# Patient Record
Sex: Female | Born: 1990 | Race: Black or African American | Hispanic: No | Marital: Single | Smoking: Never smoker
Health system: Southern US, Community
[De-identification: ages and names within clinical notes are randomized; demographics above are authoritative.]

## PROBLEM LIST (undated history)

## (undated) ENCOUNTER — Inpatient Hospital Stay (HOSPITAL_COMMUNITY): Payer: Self-pay

## (undated) DIAGNOSIS — J45909 Unspecified asthma, uncomplicated: Secondary | ICD-10-CM

## (undated) DIAGNOSIS — L309 Dermatitis, unspecified: Secondary | ICD-10-CM

## (undated) DIAGNOSIS — D649 Anemia, unspecified: Secondary | ICD-10-CM

## (undated) HISTORY — PX: WISDOM TOOTH EXTRACTION: SHX21

---

## 2013-12-11 ENCOUNTER — Encounter (HOSPITAL_BASED_OUTPATIENT_CLINIC_OR_DEPARTMENT_OTHER): Payer: Self-pay | Admitting: Emergency Medicine

## 2013-12-11 ENCOUNTER — Emergency Department (HOSPITAL_BASED_OUTPATIENT_CLINIC_OR_DEPARTMENT_OTHER)
Admission: EM | Admit: 2013-12-11 | Discharge: 2013-12-11 | Disposition: A | Discharge status: Home / Self Care | Payer: Self-pay | Attending: Emergency Medicine | Admitting: Emergency Medicine

## 2013-12-11 DIAGNOSIS — J45909 Unspecified asthma, uncomplicated: Secondary | ICD-10-CM | POA: Insufficient documentation

## 2013-12-11 DIAGNOSIS — B9689 Other specified bacterial agents as the cause of diseases classified elsewhere: Secondary | ICD-10-CM | POA: Insufficient documentation

## 2013-12-11 DIAGNOSIS — N760 Acute vaginitis: Secondary | ICD-10-CM | POA: Insufficient documentation

## 2013-12-11 DIAGNOSIS — Z3202 Encounter for pregnancy test, result negative: Secondary | ICD-10-CM | POA: Insufficient documentation

## 2013-12-11 DIAGNOSIS — A499 Bacterial infection, unspecified: Secondary | ICD-10-CM | POA: Insufficient documentation

## 2013-12-11 DIAGNOSIS — N390 Urinary tract infection, site not specified: Secondary | ICD-10-CM | POA: Insufficient documentation

## 2013-12-11 DIAGNOSIS — A599 Trichomoniasis, unspecified: Secondary | ICD-10-CM | POA: Insufficient documentation

## 2013-12-11 DIAGNOSIS — Z862 Personal history of diseases of the blood and blood-forming organs and certain disorders involving the immune mechanism: Secondary | ICD-10-CM | POA: Insufficient documentation

## 2013-12-11 HISTORY — DX: Anemia, unspecified: D64.9

## 2013-12-11 HISTORY — DX: Unspecified asthma, uncomplicated: J45.909

## 2013-12-11 LAB — URINALYSIS, ROUTINE W REFLEX MICROSCOPIC
Bilirubin Urine: NEGATIVE
GLUCOSE, UA: NEGATIVE mg/dL
KETONES UR: NEGATIVE mg/dL
Nitrite: NEGATIVE
PH: 6 (ref 5.0–8.0)
Protein, ur: NEGATIVE mg/dL
Specific Gravity, Urine: 1.019 (ref 1.005–1.030)
Urobilinogen, UA: 0.2 mg/dL (ref 0.0–1.0)

## 2013-12-11 LAB — WET PREP, GENITAL: Yeast Wet Prep HPF POC: NONE SEEN

## 2013-12-11 LAB — URINE MICROSCOPIC-ADD ON

## 2013-12-11 LAB — PREGNANCY, URINE: Preg Test, Ur: NEGATIVE

## 2013-12-11 MED ORDER — CEFTRIAXONE SODIUM 250 MG IJ SOLR
250.0000 mg | Freq: Once | INTRAMUSCULAR | Status: AC
  Administered 2013-12-11: 250 mg via INTRAMUSCULAR
  Filled 2013-12-11: qty 250

## 2013-12-11 MED ORDER — METRONIDAZOLE 500 MG PO TABS: 500.0000 mg | ORAL_TABLET | Freq: Two times a day (BID) | ORAL | Status: DC

## 2013-12-11 MED ORDER — AZITHROMYCIN 250 MG PO TABS
1000.0000 mg | ORAL_TABLET | Freq: Once | ORAL | Status: AC
  Administered 2013-12-11: 1000 mg via ORAL
  Filled 2013-12-11: qty 4

## 2013-12-11 NOTE — ED Notes (Signed)
VSS, patient comfortable, no adverse reaction reaction to antibiotic

## 2013-12-11 NOTE — Discharge Instructions (Signed)
It is important that you follow up with the health department for further screening for HIV, Hepatitis and Syphilis.

## 2013-12-11 NOTE — ED Notes (Signed)
Here to 'be checked out.'  Wants evaluation for an STD d/t vaginal discharge (white, non odorous).  Reports two female partners, has only used a condom with one of the partners.  Denies prior history of STD.

## 2013-12-11 NOTE — ED Provider Notes (Signed)
CSN: [id number]     Arrival date & time 12/11/13  1505 History   First MD Initiated Contact with Patient 12/11/13 1509     Chief Complaint  Patient presents with  . Vaginal Discharge     (Consider location/radiation/quality/duration/timing/severity/associated sxs/prior Treatment) Patient is a 23 y.o. female presenting with vaginal discharge. The history is provided by the patient.  Vaginal Discharge Quality:  White and bloody Severity:  Moderate Onset quality:  Gradual Duration:  1 week Timing:  Constant Progression:  Worsening Chronicity:  New Relieved by:  Nothing Worsened by:  Nothing tried Ineffective treatments:  None tried Associated symptoms: vaginal itching   Associated symptoms: no dyspareunia, no dysuria, no fever, no nausea, no urinary frequency, no urinary hesitancy, no urinary incontinence and no vomiting  Abdominal pain: cramping with menses.   Risk factors: foreign body, new sexual partner and unprotected sex    Zannie CoveKameisha Tench is a 23 y.o. female who presents to the ED with vaginal discharge that started over a week ago. She is currently having her period. She has been with her current sex partner x one month. She had 2 partners prior to that. She request testing for STI's. She does not use birth control. She has had abdominal cramping which is usually for he period time.   Past Medical History  Diagnosis Date  . Anemia   . Asthma    Past Surgical History  Procedure Laterality Date  . Wisdom tooth extraction     No family history on file. History  Substance Use Topics  . Smoking status: Never Smoker   . Smokeless tobacco: Not on file  . Alcohol Use: No   OB History   Grav Para Term Preterm Abortions TAB SAB Ect Mult Living                 Review of Systems  Constitutional: Negative for fever and chills.  HENT: Negative.   Eyes: Negative for visual disturbance.  Respiratory: Negative for cough and shortness of breath.   Cardiovascular: Negative  for chest pain.  Gastrointestinal: Negative for nausea and vomiting. Abdominal pain: cramping with menses.  Genitourinary: Positive for vaginal discharge. Negative for bladder incontinence, dysuria, hesitancy, urgency, frequency and dyspareunia. Vaginal pain: itching.  Musculoskeletal: Negative for back pain.  Skin: Negative for rash.  Neurological: Negative for light-headedness and headaches.  Psychiatric/Behavioral: The patient is not nervous/anxious.       Allergies  Review of patient's allergies indicates no known allergies.  Home Medications  No current outpatient prescriptions on file. BP 128/89  Pulse 72  Temp(Src) 98.9 F (37.2 C) (Oral)  Resp 18  Ht 5\' 7"  (1.702 m)  Wt 147 lb (66.679 kg)  BMI 23.02 kg/m2  SpO2 96%  LMP 12/07/2013 Physical Exam  Nursing note and vitals reviewed. Constitutional: She is oriented to person, place, and time. She appears well-developed and well-nourished. No distress.  HENT:  Head: Normocephalic and atraumatic.  Eyes: EOM are normal.  Neck: Neck supple.  Cardiovascular: Normal rate.   Pulmonary/Chest: Effort normal.  Abdominal: Soft. There is no tenderness.  Genitourinary:  External genitalia without lesions, frothy malodorous discharge vaginal vault. Positive CMT, no adnexal tenderness. Uterus without palpable enlargement.   Musculoskeletal: Normal range of motion.  Neurological: She is alert and oriented to person, place, and time. No cranial nerve deficit.  Skin: Skin is warm and dry.  Psychiatric: She has a normal mood and affect. Her behavior is normal.   Results for orders placed during  the hospital encounter of 12/11/13 (from the past 24 hour(s))  PREGNANCY, URINE     Status: None   Collection Time    12/11/13  3:26 PM      Result Value Ref Range   Preg Test, Ur NEGATIVE  NEGATIVE  URINALYSIS, ROUTINE W REFLEX MICROSCOPIC     Status: Abnormal   Collection Time    12/11/13  3:26 PM      Result Value Ref Range   Color,  Urine YELLOW  YELLOW   APPearance CLOUDY (*) CLEAR   Specific Gravity, Urine 1.019  1.005 - 1.030   pH 6.0  5.0 - 8.0   Glucose, UA NEGATIVE  NEGATIVE mg/dL   Hgb urine dipstick MODERATE (*) NEGATIVE   Bilirubin Urine NEGATIVE  NEGATIVE   Ketones, ur NEGATIVE  NEGATIVE mg/dL   Protein, ur NEGATIVE  NEGATIVE mg/dL   Urobilinogen, UA 0.2  0.0 - 1.0 mg/dL   Nitrite NEGATIVE  NEGATIVE   Leukocytes, UA MODERATE (*) NEGATIVE  URINE MICROSCOPIC-ADD ON     Status: Abnormal   Collection Time    12/11/13  3:26 PM      Result Value Ref Range   Squamous Epithelial / LPF FEW (*) RARE   WBC, UA 3-6  <3 WBC/hpf   RBC / HPF 0-2  <3 RBC/hpf   Bacteria, UA MANY (*) RARE   Urine-Other MUCOUS PRESENT    WET PREP, GENITAL     Status: Abnormal   Collection Time    12/11/13  4:14 PM      Result Value Ref Range   Yeast Wet Prep HPF POC NONE SEEN  NONE SEEN   Trich, Wet Prep TOO NUMEROUS TO COUNT (*) NONE SEEN   Clue Cells Wet Prep HPF POC MANY (*) NONE SEEN   WBC, Wet Prep HPF POC FEW (*) NONE SEEN     ED Course  Procedures  MDM  23 y.o. female with multiple sex partners, vaginal discharge and requesting STI check. Wet prep positive for trichomonas and bacterial vaginosis. Will treat with Rocephin 250 mg IM and zithromax 1 gram PO here in the ED prior to discharge. Will give Rx for Flagyl. Encouraged patient to follow up with the health department for further testing for HIV, Hepatitis and Syphilis. She agrees.  Stable for discharge without any immediate complications. Discussed safe sex.    Medication List         metroNIDAZOLE 500 MG tablet  Commonly known as:  FLAGYL  Take 1 tablet (500 mg total) by mouth 2 (two) times daily.           Janne Napoleon, [place] 12/11/13 [phone]

## 2013-12-12 NOTE — ED Provider Notes (Signed)
Medical screening examination/treatment/procedure(s) were performed by non-physician practitioner and as supervising physician I was immediately available for consultation/collaboration.   EKG Interpretation None        Audree CamelScott T Kendric Sindelar, MD 12/12/13 1143

## 2013-12-13 LAB — GC/CHLAMYDIA PROBE AMP
CT Probe RNA: NEGATIVE
GC Probe RNA: NEGATIVE

## 2014-09-19 ENCOUNTER — Inpatient Hospital Stay (HOSPITAL_COMMUNITY): Payer: Medicaid Other

## 2014-09-19 ENCOUNTER — Inpatient Hospital Stay (HOSPITAL_COMMUNITY)
Admission: AD | Admit: 2014-09-19 | Discharge: 2014-09-19 | Disposition: A | Discharge status: Home / Self Care | Payer: Medicaid Other | Source: Ambulatory Visit | Attending: Obstetrics & Gynecology | Admitting: Obstetrics & Gynecology

## 2014-09-19 ENCOUNTER — Encounter (HOSPITAL_COMMUNITY): Payer: Self-pay | Admitting: *Deleted

## 2014-09-19 DIAGNOSIS — A599 Trichomoniasis, unspecified: Secondary | ICD-10-CM | POA: Insufficient documentation

## 2014-09-19 DIAGNOSIS — F1290 Cannabis use, unspecified, uncomplicated: Secondary | ICD-10-CM | POA: Insufficient documentation

## 2014-09-19 DIAGNOSIS — R109 Unspecified abdominal pain: Secondary | ICD-10-CM | POA: Diagnosis not present

## 2014-09-19 DIAGNOSIS — O209 Hemorrhage in early pregnancy, unspecified: Secondary | ICD-10-CM | POA: Diagnosis not present

## 2014-09-19 DIAGNOSIS — F1210 Cannabis abuse, uncomplicated: Secondary | ICD-10-CM

## 2014-09-19 DIAGNOSIS — Z3A01 Less than 8 weeks gestation of pregnancy: Secondary | ICD-10-CM | POA: Diagnosis not present

## 2014-09-19 DIAGNOSIS — O418X10 Other specified disorders of amniotic fluid and membranes, first trimester, not applicable or unspecified: Secondary | ICD-10-CM

## 2014-09-19 DIAGNOSIS — O9989 Other specified diseases and conditions complicating pregnancy, childbirth and the puerperium: Secondary | ICD-10-CM

## 2014-09-19 DIAGNOSIS — O99321 Drug use complicating pregnancy, first trimester: Secondary | ICD-10-CM

## 2014-09-19 DIAGNOSIS — Z3490 Encounter for supervision of normal pregnancy, unspecified, unspecified trimester: Secondary | ICD-10-CM

## 2014-09-19 DIAGNOSIS — R52 Pain, unspecified: Secondary | ICD-10-CM

## 2014-09-19 DIAGNOSIS — O468X1 Other antepartum hemorrhage, first trimester: Secondary | ICD-10-CM

## 2014-09-19 DIAGNOSIS — O98311 Other infections with a predominantly sexual mode of transmission complicating pregnancy, first trimester: Secondary | ICD-10-CM

## 2014-09-19 LAB — URINALYSIS, ROUTINE W REFLEX MICROSCOPIC
Bilirubin Urine: NEGATIVE
GLUCOSE, UA: NEGATIVE mg/dL
Hgb urine dipstick: NEGATIVE
Ketones, ur: NEGATIVE mg/dL
LEUKOCYTES UA: NEGATIVE
NITRITE: NEGATIVE
PH: 6.5 (ref 5.0–8.0)
Protein, ur: NEGATIVE mg/dL
SPECIFIC GRAVITY, URINE: 1.02 (ref 1.005–1.030)
Urobilinogen, UA: 0.2 mg/dL (ref 0.0–1.0)

## 2014-09-19 LAB — RAPID URINE DRUG SCREEN, HOSP PERFORMED
Amphetamines: NOT DETECTED
BENZODIAZEPINES: NOT DETECTED
Barbiturates: NOT DETECTED
COCAINE: NOT DETECTED
OPIATES: NOT DETECTED
TETRAHYDROCANNABINOL: POSITIVE — AB

## 2014-09-19 LAB — CBC
HCT: 35.3 % — ABNORMAL LOW (ref 36.0–46.0)
HEMOGLOBIN: 12 g/dL (ref 12.0–15.0)
MCH: 26.3 pg (ref 26.0–34.0)
MCHC: 34 g/dL (ref 30.0–36.0)
MCV: 77.2 fL — AB (ref 78.0–100.0)
Platelets: 298 10*3/uL (ref 150–400)
RBC: 4.57 MIL/uL (ref 3.87–5.11)
RDW: 12.9 % (ref 11.5–15.5)
WBC: 7.8 10*3/uL (ref 4.0–10.5)

## 2014-09-19 LAB — HCG, QUANTITATIVE, PREGNANCY: HCG, BETA CHAIN, QUANT, S: 26149 m[IU]/mL — AB (ref <5)

## 2014-09-19 LAB — WET PREP, GENITAL: YEAST WET PREP: NONE SEEN

## 2014-09-19 LAB — ABO/RH: ABO/RH(D): A POS

## 2014-09-19 LAB — POCT PREGNANCY, URINE: PREG TEST UR: POSITIVE — AB

## 2014-09-19 MED ORDER — METRONIDAZOLE 500 MG PO TABS: 2000.0000 mg | ORAL_TABLET | Freq: Once | ORAL | Status: DC

## 2014-09-19 NOTE — MAU Provider Note (Signed)
History     CSN: [id number]  Arrival date and time: 09/19/14 1533   None     Chief Complaint  Patient presents with  . Abdominal Pain   HPI Comments: Cheryl Mcbride 23 y.o. G1P0 [id number] presents to MAU with abdominal cramping that started today. She denies any bleeding. The pain is 7/10 and she declines anything for pain. She has not established prenatal care.   Abdominal Pain      Past Medical History  Diagnosis Date  . Anemia   . Asthma     Past Surgical History  Procedure Laterality Date  . Wisdom tooth extraction      History reviewed. No pertinent family history.  History  Substance Use Topics  . Smoking status: Never Smoker   . Smokeless tobacco: Not on file  . Alcohol Use: No    Allergies: No Known Allergies  No prescriptions prior to admission    Review of Systems  Constitutional: Negative.   HENT: Negative.   Eyes: Negative.   Cardiovascular: Negative.   Gastrointestinal: Positive for abdominal pain.  Genitourinary: Negative.   Musculoskeletal: Negative.   Skin: Negative.   Neurological: Negative.   Psychiatric/Behavioral: Negative.    Physical Exam   Blood pressure 128/69, pulse 83, temperature 98.2 F (36.8 C), temperature source Oral, resp. rate 16, height 5\' 7"  (1.702 m), weight 147 lb 9.6 oz (66.951 kg), last menstrual period 08/02/2014.  Physical Exam  Constitutional: She is oriented to person, place, and time. She appears well-developed and well-nourished. No distress.  HENT:  Head: Normocephalic and atraumatic.  Eyes: Pupils are equal, round, and reactive to light.  GI: Soft. Bowel sounds are normal. She exhibits no distension. There is no tenderness. There is no rebound and no guarding.  Genitourinary:  Genital: external negative Vaginal: small amount white discharge Cervix:closed/ thick Bimanual: nontender gravid   Musculoskeletal: Normal range of motion.  Neurological: She is alert and oriented to person, place, and  time.  Skin: Skin is warm and dry.  Psychiatric: She has a normal mood and affect. Her behavior is normal. Judgment and thought content normal.   Results for orders placed or performed during the hospital encounter of 09/19/14 (from the past 24 hour(s))  Drug screen panel, emergency     Status: Abnormal   Collection Time: 09/19/14  3:50 PM  Result Value Ref Range   Opiates NONE DETECTED NONE DETECTED   Cocaine NONE DETECTED NONE DETECTED   Benzodiazepines NONE DETECTED NONE DETECTED   Amphetamines NONE DETECTED NONE DETECTED   Tetrahydrocannabinol POSITIVE (A) NONE DETECTED   Barbiturates NONE DETECTED NONE DETECTED  Urinalysis, Routine w reflex microscopic     Status: None   Collection Time: 09/19/14  4:00 PM  Result Value Ref Range   Color, Urine YELLOW YELLOW   APPearance CLEAR CLEAR   Specific Gravity, Urine 1.020 1.005 - 1.030   pH 6.5 5.0 - 8.0   Glucose, UA NEGATIVE NEGATIVE mg/dL   Hgb urine dipstick NEGATIVE NEGATIVE   Bilirubin Urine NEGATIVE NEGATIVE   Ketones, ur NEGATIVE NEGATIVE mg/dL   Protein, ur NEGATIVE NEGATIVE mg/dL   Urobilinogen, UA 0.2 0.0 - 1.0 mg/dL   Nitrite NEGATIVE NEGATIVE   Leukocytes, UA NEGATIVE NEGATIVE  Pregnancy, urine POC     Status: Abnormal   Collection Time: 09/19/14  4:05 PM  Result Value Ref Range   Preg Test, Ur POSITIVE (A) NEGATIVE  CBC     Status: Abnormal   Collection Time: 09/19/14  4:33  PM  Result Value Ref Range   WBC 7.8 4.0 - 10.5 K/uL   RBC 4.57 3.87 - 5.11 MIL/uL   Hemoglobin 12.0 12.0 - 15.0 g/dL   HCT 40.9 (L) 81.1 - 91.4 %   MCV 77.2 (L) 78.0 - 100.0 fL   MCH 26.3 26.0 - 34.0 pg   MCHC 34.0 30.0 - 36.0 g/dL   RDW 78.2 95.6 - 21.3 %   Platelets 298 150 - 400 K/uL  hCG, quantitative, pregnancy     Status: Abnormal   Collection Time: 09/19/14  4:33 PM  Result Value Ref Range   hCG, Beta Chain, Quant, S 08657 (H) <5 mIU/mL  ABO/Rh     Status: None   Collection Time: 09/19/14  4:33 PM  Result Value Ref Range    ABO/RH(D) A POS   Wet prep, genital     Status: Abnormal   Collection Time: 09/19/14  5:50 PM  Result Value Ref Range   Yeast Wet Prep HPF POC NONE SEEN NONE SEEN   Trich, Wet Prep FEW (A) NONE SEEN   Clue Cells Wet Prep HPF POC FEW (A) NONE SEEN   WBC, Wet Prep HPF POC FEW (A) NONE SEEN  . US Ob Comp Less 14 Wks  09/19/2014   CLINICAL DATA:  Lower abdominal/ pelvic pain  EXAM: OBSTETRIC <14 WK [place] AND TRANSVAGINAL OB US  TECHNIQUE: Both transabdominal and transvaginal ultrasound examinations were performed for complete evaluation of the gestation as well as the maternal uterus, adnexal regions, and pelvic cul-de-sac. Transvaginal technique was performed to assess early pregnancy.  COMPARISON:  None.  FINDINGS: Intrauterine gestational sac: Visualized/normal in shape.  Yolk sac:  Visualized  Embryo:  Visualized  Cardiac Activity: Visualized  Heart Rate:  122 bpm  CRL:   8  mm   6 w 6 d                  [place] EDC: May 09, 2015  Maternal uterus/adnexae: There is a subchorionic hemorrhage measuring 3.4 x 1.4 cm. The cervical os is closed. Maternal ovaries appear normal in size and contour. There is trace free pelvic fluid.  IMPRESSION: Single live intrauterine gestation with estimated gestational age of 43- weeks. Moderate size subchorionic hemorrhage. This finding warrant close clinical and imaging surveillance. Minimal free pelvic fluid in the maternal pelvis may be physiologic.   Electronically Signed   By: Bretta Bang M.D.   On: 09/19/2014 17:41   US Ob Transvaginal  09/19/2014   CLINICAL DATA:  Lower abdominal/ pelvic pain  EXAM: OBSTETRIC <14 WK [place] AND TRANSVAGINAL OB US  TECHNIQUE: Both transabdominal and transvaginal ultrasound examinations were performed for complete evaluation of the gestation as well as the maternal uterus, adnexal regions, and pelvic cul-de-sac. Transvaginal technique was performed to assess early pregnancy.  COMPARISON:  None.  FINDINGS: Intrauterine gestational sac:  Visualized/normal in shape.  Yolk sac:  Visualized  Embryo:  Visualized  Cardiac Activity: Visualized  Heart Rate:  122 bpm  CRL:   8  mm   6 w 6 d                  [place] EDC: May 09, 2015  Maternal uterus/adnexae: There is a subchorionic hemorrhage measuring 3.4 x 1.4 cm. The cervical os is closed. Maternal ovaries appear normal in size and contour. There is trace free pelvic fluid.  IMPRESSION: Single live intrauterine gestation with estimated gestational age of 15- weeks. Moderate size subchorionic hemorrhage. This  finding warrant close clinical and imaging surveillance. Minimal free pelvic fluid in the maternal pelvis may be physiologic.   Electronically Signed   By: Bretta BangWilliam  Woodruff M.D.   On: 09/19/2014 17:41    MAU Course  Procedures  MDM  Wet prep, GC, Chlamydia, CBC, UA, U/S, ABORh, Quant, HIV, UDS   Assessment and Plan   A: Abdominal pain in early pregnancy Moderate subchorionic hemorrhage Trichomonas Marijuana Use in pregnancy  P: Above orders Pelvic rest/ miscarriage precautions Flagyl 2 Grams/ partner referral Start Prenatal vitamins and prenatal care asap Return to MAU as needed  Carolynn ServeBarefoot, Divante Kotch Miller 09/20/2014, 10:32 AM

## 2014-09-19 NOTE — MAU Note (Signed)
Lower abd cramping started today, pos HPT last week.  Denies bleeding.

## 2014-09-19 NOTE — Discharge Instructions (Signed)
Trichomoniasis Trichomoniasis is an infection caused by an organism called Trichomonas. The infection can affect both women and men. In women, the outer female genitalia and the vagina are affected. In men, the penis is mainly affected, but the prostate and other reproductive organs can also be involved. Trichomoniasis is a sexually transmitted infection (STI) and is most often passed to another person through sexual contact.  RISK FACTORS  Having unprotected sexual intercourse.  Having sexual intercourse with an infected partner. SIGNS AND SYMPTOMS  Symptoms of trichomoniasis in women include:  Abnormal gray-green frothy vaginal discharge.  Itching and irritation of the vagina.  Itching and irritation of the area outside the vagina. Symptoms of trichomoniasis in men include:   Penile discharge with or without pain.  Pain during urination. This results from inflammation of the urethra. DIAGNOSIS  Trichomoniasis may be found during a Pap test or physical exam. Your health care provider may use one of the following methods to help diagnose this infection:  Examining vaginal discharge under a microscope. For men, urethral discharge would be examined.  Testing the pH of the vagina with a test tape.  Using a vaginal swab test that checks for the Trichomonas organism. A test is available that provides results within a few minutes.  Doing a culture test for the organism. This is not usually needed. TREATMENT   You may be given medicine to fight the infection. Women should inform their health care provider if they could be or are pregnant. Some medicines used to treat the infection should not be taken during pregnancy.  Your health care provider may recommend over-the-counter medicines or creams to decrease itching or irritation.  Your sexual partner will need to be treated if infected. HOME CARE INSTRUCTIONS   Take medicines only as directed by your health care provider.  Take  over-the-counter medicine for itching or irritation as directed by your health care provider.  Do not have sexual intercourse while you have the infection.  Women should not douche or wear tampons while they have the infection.  Discuss your infection with your partner. Your partner may have gotten the infection from you, or you may have gotten it from your partner.  Have your sex partner get examined and treated if necessary.  Practice safe, informed, and protected sex.  See your health care provider for other STI testing. SEEK MEDICAL CARE IF:   You still have symptoms after you finish your medicine.  You develop abdominal pain.  You have pain when you urinate.  You have bleeding after sexual intercourse.  You develop a rash.  Your medicine makes you sick or makes you throw up (vomit). MAKE SURE YOU:  Understand these instructions.  Will watch your condition.  Will get help right away if you are not doing well or get worse. Document Released: 03/12/2001 Document Revised: 01/31/2014 Document Reviewed: 06/28/2013 [facility] CenterExitCare Patient Information 2015 [place], [place]. This information is not intended to replace advice given to you by your health care provider. Make sure you discuss any questions you have with your health care provider. Threatened Miscarriage A threatened miscarriage occurs when you have vaginal bleeding during your first 20 weeks of pregnancy but the pregnancy has not ended. If you have vaginal bleeding during this time, your health care provider will do tests to make sure you are still pregnant. If the tests show you are still pregnant and the developing baby (fetus) inside your womb (uterus) is still growing, your condition is considered a threatened miscarriage. A  threatened miscarriage does not mean your pregnancy will end, but it does increase the risk of losing your pregnancy (complete miscarriage). CAUSES  The cause of a threatened miscarriage is usually not  known. If you go on to have a complete miscarriage, the most common cause is an abnormal number of chromosomes in the developing baby. Chromosomes are the structures inside cells that hold all your genetic material. Some causes of vaginal bleeding that do not result in miscarriage include:  Having sex.  Having an infection.  Normal hormone changes of pregnancy.  Bleeding that occurs when an egg implants in your uterus. RISK FACTORS Risk factors for bleeding in early pregnancy include:  Obesity.  Smoking.  Drinking excessive amounts of alcohol or caffeine.  Recreational drug use. SIGNS AND SYMPTOMS  Light vaginal bleeding.  Mild abdominal pain or cramps. DIAGNOSIS  If you have bleeding with or without abdominal pain before 20 weeks of pregnancy, your health care provider will do tests to check whether you are still pregnant. One important test involves using sound waves and a computer (ultrasound) to create images of the inside of your uterus. Other tests include an internal exam of your vagina and uterus (pelvic exam) and measurement of your baby's heart rate.  You may be diagnosed with a threatened miscarriage if:  Ultrasound testing shows you are still pregnant.  Your baby's heart rate is strong.  A pelvic exam shows that the opening between your uterus and your vagina (cervix) is closed.  Your heart rate and blood pressure are stable.  Blood tests confirm you are still pregnant. TREATMENT  No treatments have been shown to prevent a threatened miscarriage from going on to a complete miscarriage. However, the right home care is important.  HOME CARE INSTRUCTIONS   Make sure you keep all your appointments for prenatal care. This is very important.  Get plenty of rest.  Do not have sex or use tampons if you have vaginal bleeding.  Do not douche.  Do not smoke or use recreational drugs.  Do not drink alcohol.  Avoid caffeine. SEEK MEDICAL CARE IF:  You have  light vaginal bleeding or spotting while pregnant.  You have abdominal pain or cramping.  You have a fever. SEEK IMMEDIATE MEDICAL CARE IF:  You have heavy vaginal bleeding.  You have blood clots coming from your vagina.  You have severe low back pain or abdominal cramps.  You have fever, chills, and severe abdominal pain. MAKE SURE YOU:  Understand these instructions.  Will watch your condition.  Will get help right away if you are not doing well or get worse. Document Released: 09/16/2005 Document Revised: 09/21/2013 Document Reviewed: 07/13/2013 [facility]ExitCare Patient Information 2015 [place], [place]. This information is not intended to replace advice given to you by your health care provider. Make sure you discuss any questions you have with your health care provider.

## 2014-09-28 LAB — HIV ANTIBODY (ROUTINE TESTING W REFLEX)

## 2014-09-30 NOTE — L&D Delivery Note (Signed)
Pt presented in active labor. She completed the first stage without difficulty. She pushed for 40 min and had a SVD of one live viable black infant in the LOP position. Nuchal cord x 1. Placenta-S/I. Small perineal tear closed with 3-0 chromic. Baby to NBN. [id number]

## 2014-10-05 LAB — GC/CHLAMYDIA PROBE AMP
CT Probe RNA: NEGATIVE
GC Probe RNA: NEGATIVE

## 2014-10-05 LAB — HIV ANTIBODY: HIV: NONREACTIVE

## 2014-11-29 LAB — OB RESULTS CONSOLE GC/CHLAMYDIA
CHLAMYDIA, DNA PROBE: NEGATIVE
Gonorrhea: NEGATIVE

## 2014-12-16 LAB — OB RESULTS CONSOLE HEPATITIS B SURFACE ANTIGEN: Hepatitis B Surface Ag: NEGATIVE

## 2014-12-16 LAB — OB RESULTS CONSOLE ANTIBODY SCREEN: ANTIBODY SCREEN: NEGATIVE

## 2014-12-16 LAB — OB RESULTS CONSOLE RUBELLA ANTIBODY, IGM: RUBELLA: IMMUNE

## 2014-12-16 LAB — OB RESULTS CONSOLE HIV ANTIBODY (ROUTINE TESTING): HIV: NONREACTIVE

## 2014-12-16 LAB — OB RESULTS CONSOLE ABO/RH: RH TYPE: POSITIVE

## 2014-12-16 LAB — OB RESULTS CONSOLE RPR: RPR: NONREACTIVE

## 2015-04-06 LAB — OB RESULTS CONSOLE GBS: STREP GROUP B AG: POSITIVE

## 2015-04-26 IMAGING — US US OB TRANSVAGINAL
1 series · 14 of 28 positions shown · non-contrast
Comparison: None.

CLINICAL DATA: Lower abdominal/ pelvic pain

EXAM:
OBSTETRIC <14 WK US AND TRANSVAGINAL OB US
TECHNIQUE: Both transabdominal and transvaginal ultrasound examinations were
performed for complete evaluation of the gestation as well as the
maternal uterus, adnexal regions, and pelvic cul-de-sac.
Transvaginal technique was performed to assess early pregnancy.

[Series 1: us ob comp add'left gest less 14 wks · 73 acquisitions, 14 frames shown]
[im 3/73]
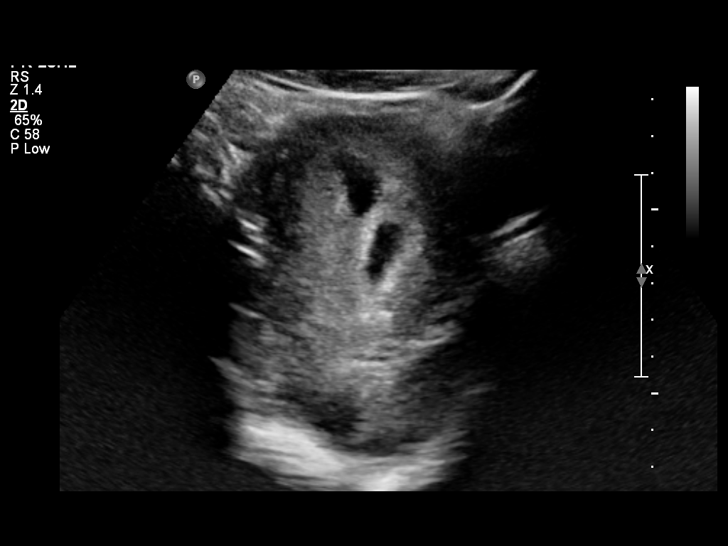
[im 9/73]
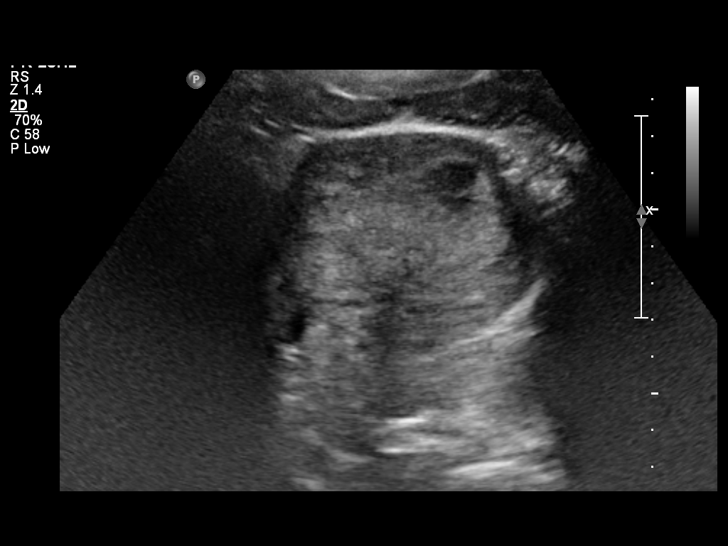
[im 14/73]
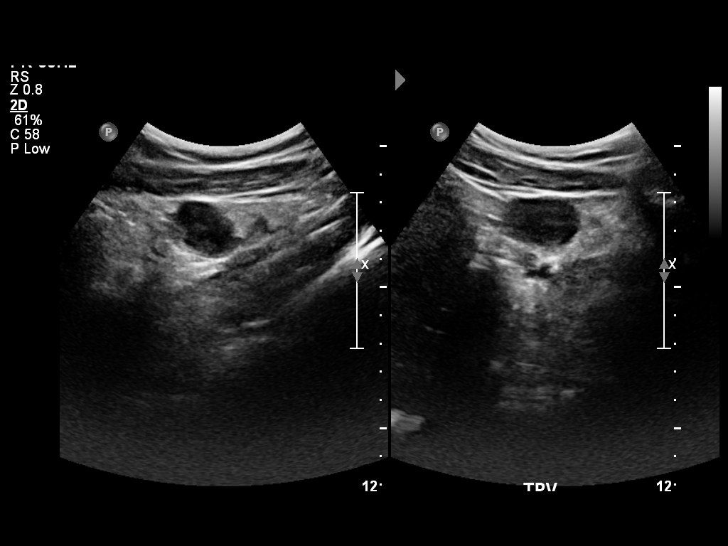
[im 19/73]
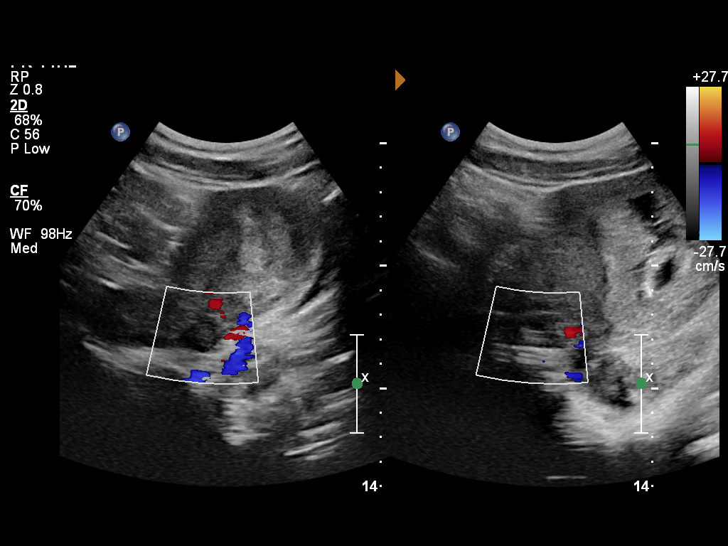
[im 25/73]
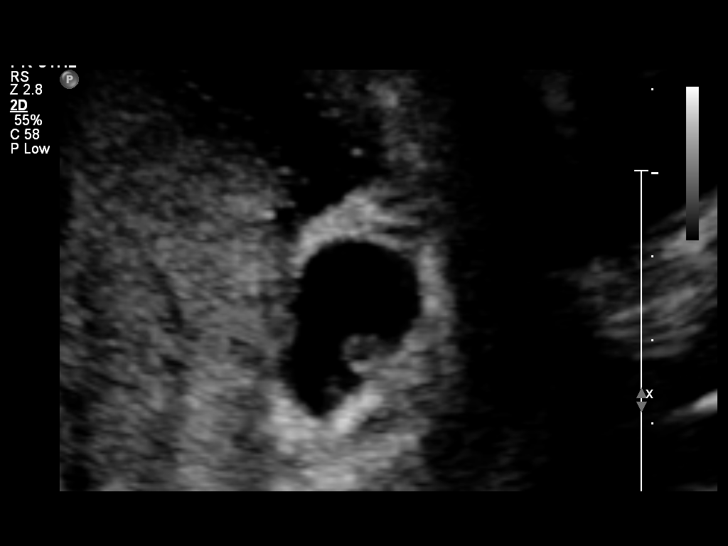
[im 30/73]
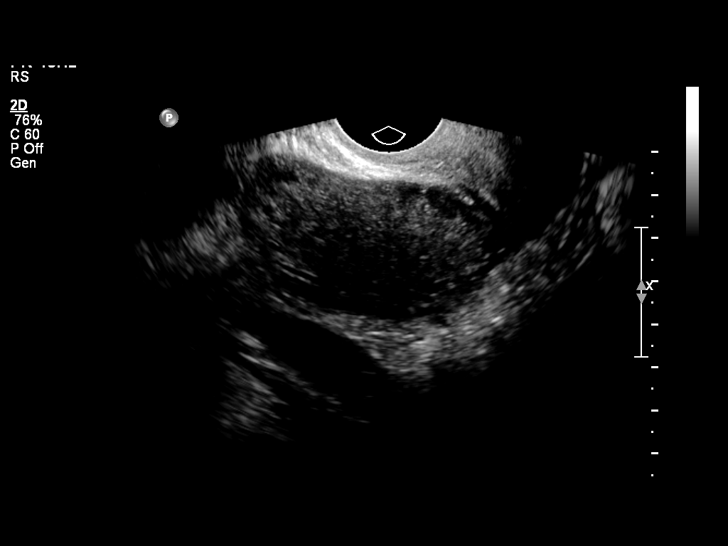
[im 35/73]
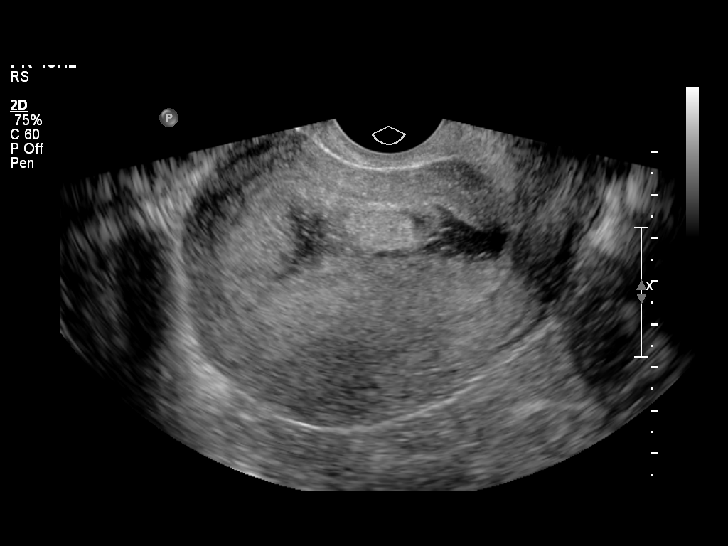
[im 41/73]
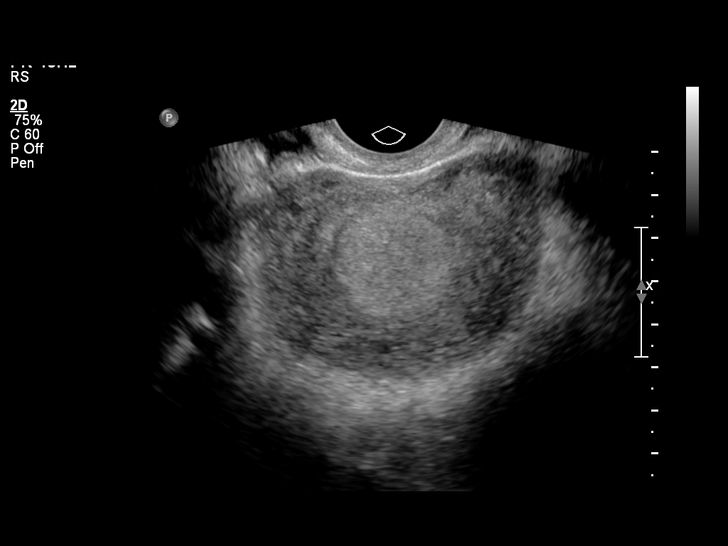
[im 46/73]
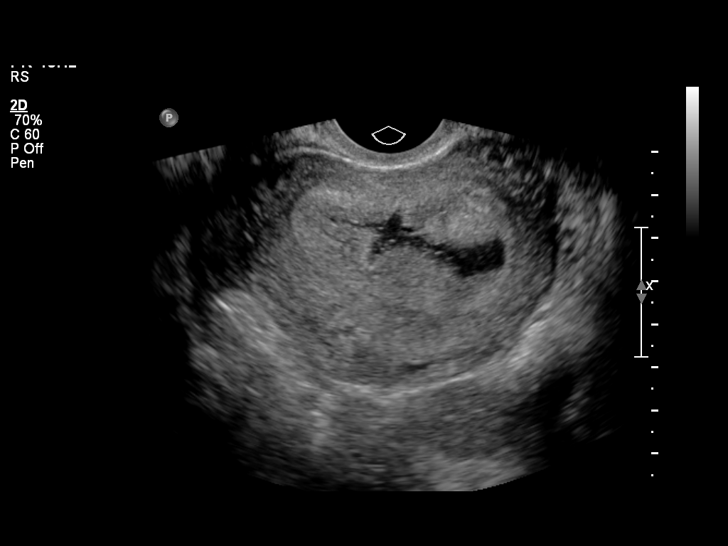
[im 51/73]
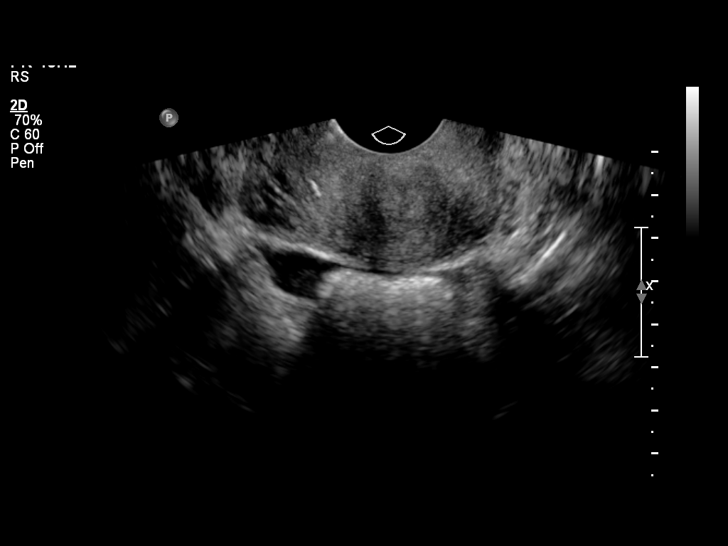
[im 57/73]
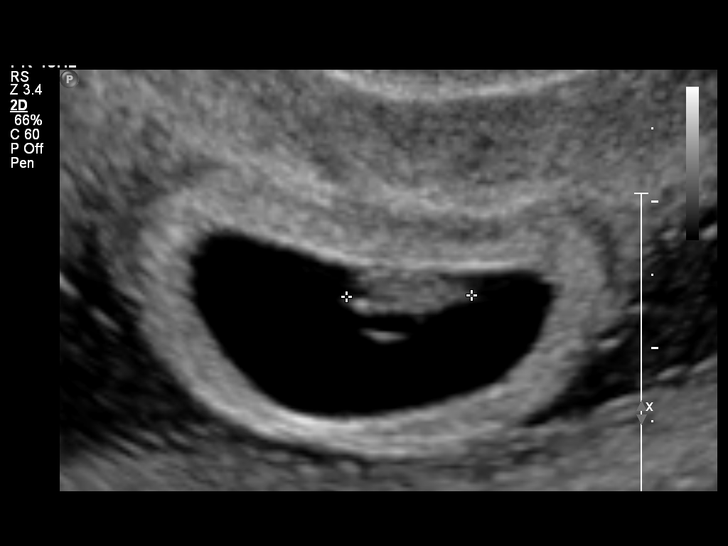
[im 62/73]
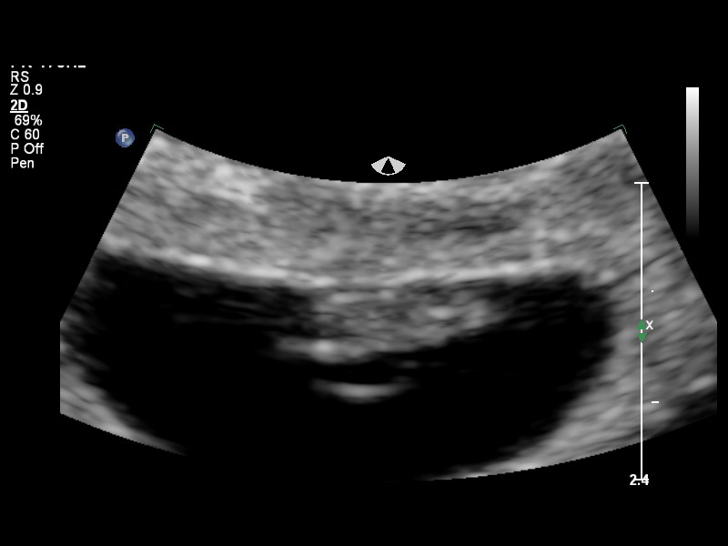
[im 67/73]
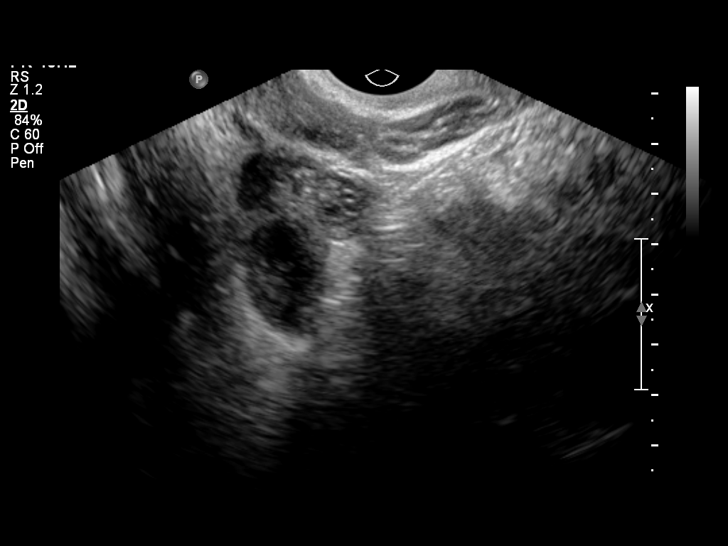
[im 73/73]
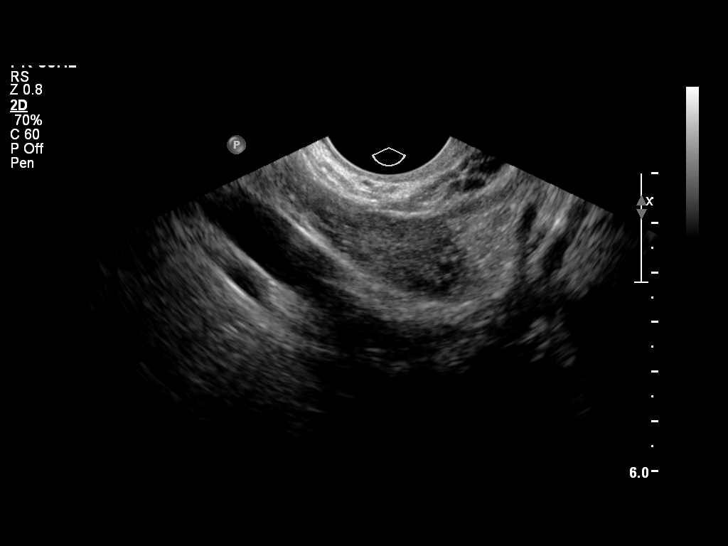

[14 of 28 positions shown; findings below may reference images not displayed]

FINDINGS: Intrauterine gestational sac: Visualized/normal in shape.

Yolk sac:  Visualized

Embryo:  Visualized

Cardiac Activity: Visualized

Heart Rate:  122 bpm

CRL:   8  mm   6 w 6 d                  US EDC: May 09, 2015

Maternal uterus/adnexae: There is a subchorionic hemorrhage
measuring 3.4 x 1.4 cm. The cervical os is closed. Maternal ovaries
appear normal in size and contour. There is trace free pelvic fluid.
IMPRESSION: Single live intrauterine gestation with estimated gestational age of
7- weeks. Moderate size subchorionic hemorrhage. This finding
warrant close clinical and imaging surveillance. Minimal free pelvic
fluid in the maternal pelvis may be physiologic.

## 2015-04-30 ENCOUNTER — Encounter (HOSPITAL_COMMUNITY): Payer: Self-pay

## 2015-04-30 ENCOUNTER — Inpatient Hospital Stay (HOSPITAL_COMMUNITY)
Admission: AD | Admit: 2015-04-30 | Discharge: 2015-05-02 | DRG: 775 | Disposition: A | Discharge status: Home / Self Care | Payer: Medicaid Other | Source: Ambulatory Visit | Attending: Obstetrics and Gynecology | Admitting: Obstetrics and Gynecology

## 2015-04-30 ENCOUNTER — Inpatient Hospital Stay (HOSPITAL_COMMUNITY)
Admission: AD | Admit: 2015-04-30 | Discharge: 2015-04-30 | Disposition: A | Discharge status: Home / Self Care | Payer: Medicaid Other | Source: Ambulatory Visit | Attending: Obstetrics and Gynecology | Admitting: Obstetrics and Gynecology

## 2015-04-30 ENCOUNTER — Encounter (HOSPITAL_COMMUNITY): Payer: Self-pay | Admitting: *Deleted

## 2015-04-30 ENCOUNTER — Inpatient Hospital Stay (HOSPITAL_COMMUNITY): Payer: Medicaid Other | Admitting: Anesthesiology

## 2015-04-30 DIAGNOSIS — O99824 Streptococcus B carrier state complicating childbirth: Secondary | ICD-10-CM | POA: Diagnosis present

## 2015-04-30 DIAGNOSIS — Z3480 Encounter for supervision of other normal pregnancy, unspecified trimester: Secondary | ICD-10-CM

## 2015-04-30 DIAGNOSIS — Z3A38 38 weeks gestation of pregnancy: Secondary | ICD-10-CM | POA: Diagnosis present

## 2015-04-30 LAB — CBC
HEMATOCRIT: 32.7 % — AB (ref 36.0–46.0)
Hemoglobin: 10.5 g/dL — ABNORMAL LOW (ref 12.0–15.0)
MCH: 25.3 pg — AB (ref 26.0–34.0)
MCHC: 32.1 g/dL (ref 30.0–36.0)
MCV: 78.8 fL (ref 78.0–100.0)
PLATELETS: 264 10*3/uL (ref 150–400)
RBC: 4.15 MIL/uL (ref 3.87–5.11)
RDW: 14.8 % (ref 11.5–15.5)
WBC: 12.6 10*3/uL — ABNORMAL HIGH (ref 4.0–10.5)

## 2015-04-30 LAB — URINALYSIS, ROUTINE W REFLEX MICROSCOPIC
Bilirubin Urine: NEGATIVE
GLUCOSE, UA: NEGATIVE mg/dL
Hgb urine dipstick: NEGATIVE
KETONES UR: NEGATIVE mg/dL
Leukocytes, UA: NEGATIVE
Nitrite: NEGATIVE
PH: 6.5 (ref 5.0–8.0)
PROTEIN: NEGATIVE mg/dL
Specific Gravity, Urine: 1.01 (ref 1.005–1.030)
Urobilinogen, UA: 0.2 mg/dL (ref 0.0–1.0)

## 2015-04-30 LAB — TYPE AND SCREEN
ABO/RH(D): A POS
ANTIBODY SCREEN: NEGATIVE

## 2015-04-30 MED ORDER — FLEET ENEMA 7-19 GM/118ML RE ENEM: 1.0000 | ENEMA | RECTAL | Status: DC | PRN

## 2015-04-30 MED ORDER — LACTATED RINGERS IV SOLN
500.0000 mL | INTRAVENOUS | Status: DC | PRN
  Administered 2015-05-01: 500 mL via INTRAVENOUS

## 2015-04-30 MED ORDER — ONDANSETRON HCL 4 MG/2ML IJ SOLN: 4.0000 mg | Freq: Four times a day (QID) | INTRAMUSCULAR | Status: DC | PRN

## 2015-04-30 MED ORDER — OXYCODONE-ACETAMINOPHEN 5-325 MG PO TABS: 2.0000 | ORAL_TABLET | ORAL | Status: DC | PRN

## 2015-04-30 MED ORDER — OXYTOCIN BOLUS FROM INFUSION: 500.0000 mL | INTRAVENOUS | Status: DC

## 2015-04-30 MED ORDER — LIDOCAINE HCL (PF) 1 % IJ SOLN
30.0000 mL | INTRAMUSCULAR | Status: DC | PRN
  Filled 2015-04-30: qty 30

## 2015-04-30 MED ORDER — CITRIC ACID-SODIUM CITRATE 334-500 MG/5ML PO SOLN: 30.0000 mL | ORAL | Status: DC | PRN

## 2015-04-30 MED ORDER — LIDOCAINE HCL (PF) 1 % IJ SOLN
INTRAMUSCULAR | Status: DC | PRN
  Administered 2015-04-30 (×2): 8 mL via EPIDURAL

## 2015-04-30 MED ORDER — PHENYLEPHRINE 40 MCG/ML (10ML) SYRINGE FOR IV PUSH (FOR BLOOD PRESSURE SUPPORT)
80.0000 ug | PREFILLED_SYRINGE | INTRAVENOUS | Status: DC | PRN
  Filled 2015-04-30: qty 2
  Filled 2015-04-30: qty 20

## 2015-04-30 MED ORDER — OXYTOCIN 40 UNITS IN LACTATED RINGERS INFUSION - SIMPLE MED
62.5000 mL/h | INTRAVENOUS | Status: DC
  Filled 2015-04-30: qty 1000

## 2015-04-30 MED ORDER — EPHEDRINE 5 MG/ML INJ
10.0000 mg | INTRAVENOUS | Status: DC | PRN
  Filled 2015-04-30: qty 2

## 2015-04-30 MED ORDER — ACETAMINOPHEN 325 MG PO TABS: 650.0000 mg | ORAL_TABLET | ORAL | Status: DC | PRN

## 2015-04-30 MED ORDER — DEXTROSE 5 % IV SOLN
2.5000 10*6.[IU] | INTRAVENOUS | Status: DC
  Filled 2015-04-30 (×5): qty 2.5

## 2015-04-30 MED ORDER — FENTANYL 2.5 MCG/ML BUPIVACAINE 1/10 % EPIDURAL INFUSION (WH - ANES)
14.0000 mL/h | INTRAMUSCULAR | Status: DC | PRN
  Administered 2015-04-30: 14 mL/h via EPIDURAL
  Filled 2015-04-30: qty 125

## 2015-04-30 MED ORDER — OXYCODONE-ACETAMINOPHEN 5-325 MG PO TABS: 1.0000 | ORAL_TABLET | ORAL | Status: DC | PRN

## 2015-04-30 MED ORDER — DIPHENHYDRAMINE HCL 50 MG/ML IJ SOLN: 12.5000 mg | INTRAMUSCULAR | Status: DC | PRN

## 2015-04-30 MED ORDER — LACTATED RINGERS IV SOLN
INTRAVENOUS | Status: DC
  Administered 2015-04-30 (×2): via INTRAVENOUS

## 2015-04-30 MED ORDER — PENICILLIN G POTASSIUM 5000000 UNITS IJ SOLR
5.0000 10*6.[IU] | Freq: Once | INTRAVENOUS | Status: AC
  Administered 2015-04-30: 5 10*6.[IU] via INTRAVENOUS
  Filled 2015-04-30: qty 5

## 2015-04-30 NOTE — MAU Note (Signed)
Contractions since 0240 and they won't go away. Denies LOF or bleeding.

## 2015-04-30 NOTE — Anesthesia Procedure Notes (Signed)
Epidural Patient location during procedure: OB Start time: 04/30/2015 9:13 PM End time: 04/30/2015 9:17 PM  Staffing Anesthesiologist: Leilani Able Performed by: anesthesiologist   Preanesthetic Checklist Completed: patient identified, surgical consent, pre-op evaluation, timeout performed, IV checked, risks and benefits discussed and monitors and equipment checked  Epidural Patient position: sitting Prep: site prepped and draped and DuraPrep Patient monitoring: continuous pulse ox and blood pressure Approach: midline Location: L3-L4 Injection technique: LOR air  Needle:  Needle type: Tuohy  Needle gauge: 17 G Needle length: 9 cm and 9 Needle insertion depth: 5 cm cm Catheter type: closed end flexible Catheter size: 19 Gauge Catheter at skin depth: 10 cm Test dose: negative and Other  Assessment Sensory level: T9 Events: blood not aspirated, injection not painful, no injection resistance, negative IV test and no paresthesia  Additional Notes Reason for block:procedure for pain

## 2015-04-30 NOTE — Discharge Instructions (Signed)
Keep Appointments Third Trimester of Pregnancy The third trimester is from week 29 through week 42, months 7 through 9. The third trimester is a time when the fetus is growing rapidly. At the end of the ninth month, the fetus is about 20 inches in length and weighs 6-10 pounds.  BODY CHANGES Your body goes through many changes during pregnancy. The changes vary from woman to woman.   Your weight will continue to increase. You can expect to gain 25-35 pounds (11-16 kg) by the end of the pregnancy.  You may begin to get stretch marks on your hips, abdomen, and breasts.  You may urinate more often because the fetus is moving lower into your pelvis and pressing on your bladder.  You may develop or continue to have heartburn as a result of your pregnancy.  You may develop constipation because certain hormones are causing the muscles that push waste through your intestines to slow down.  You may develop hemorrhoids or swollen, bulging veins (varicose veins).  You may have pelvic pain because of the weight gain and pregnancy hormones relaxing your joints between the bones in your pelvis. Backaches may result from overexertion of the muscles supporting your posture.  You may have changes in your hair. These can include thickening of your hair, rapid growth, and changes in texture. Some women also have hair loss during or after pregnancy, or hair that feels dry or thin. Your hair will most likely return to normal after your baby is born.  Your breasts will continue to grow and be tender. A yellow discharge may leak from your breasts called colostrum.  Your belly button may stick out.  You may feel short of breath because of your expanding uterus.  You may notice the fetus "dropping," or moving lower in your abdomen.  You may have a bloody mucus discharge. This usually occurs a few days to a week before labor begins.  Your cervix becomes thin and soft (effaced) near your due date. WHAT TO  EXPECT AT YOUR PRENATAL EXAMS  You will have prenatal exams every 2 weeks until week 36. Then, you will have weekly prenatal exams. During a routine prenatal visit:  You will be weighed to make sure you and the fetus are growing normally.  Your blood pressure is taken.  Your abdomen will be measured to track your baby's growth.  The fetal heartbeat will be listened to.  Any test results from the previous visit will be discussed.  You may have a cervical check near your due date to see if you have effaced. At around 36 weeks, your caregiver will check your cervix. At the same time, your caregiver will also perform a test on the secretions of the vaginal tissue. This test is to determine if a type of bacteria, Group B streptococcus, is present. Your caregiver will explain this further. Your caregiver may ask you:  What your birth plan is.  How you are feeling.  If you are feeling the baby move.  If you have had any abnormal symptoms, such as leaking fluid, bleeding, severe headaches, or abdominal cramping.  If you have any questions. Other tests or screenings that may be performed during your third trimester include:  Blood tests that check for low iron levels (anemia).  Fetal testing to check the health, activity level, and growth of the fetus. Testing is done if you have certain medical conditions or if there are problems during the pregnancy. FALSE LABOR You may feel small, irregular  contractions that eventually go away. These are called Braxton Hicks contractions, or false labor. Contractions may last for hours, days, or even weeks before true labor sets in. If contractions come at regular intervals, intensify, or become painful, it is best to be seen by your caregiver.  SIGNS OF LABOR   Menstrual-like cramps.  Contractions that are 5 minutes apart or less.  Contractions that start on the top of the uterus and spread down to the lower abdomen and back.  A sense of increased  pelvic pressure or back pain.  A watery or bloody mucus discharge that comes from the vagina. If you have any of these signs before the 37th week of pregnancy, call your caregiver right away. You need to go to the hospital to get checked immediately. HOME CARE INSTRUCTIONS   Avoid all smoking, herbs, alcohol, and unprescribed drugs. These chemicals affect the formation and growth of the baby.  Follow your caregiver's instructions regarding medicine use. There are medicines that are either safe or unsafe to take during pregnancy.  Exercise only as directed by your caregiver. Experiencing uterine cramps is a good sign to stop exercising.  Continue to eat regular, healthy meals.  Wear a good support bra for breast tenderness.  Do not use hot tubs, steam rooms, or saunas.  Wear your seat belt at all times when driving.  Avoid raw meat, uncooked cheese, cat litter boxes, and soil used by cats. These carry germs that can cause birth defects in the baby.  Take your prenatal vitamins.  Try taking a stool softener (if your caregiver approves) if you develop constipation. Eat more high-fiber foods, such as fresh vegetables or fruit and whole grains. Drink plenty of fluids to keep your urine clear or pale yellow.  Take warm sitz baths to soothe any pain or discomfort caused by hemorrhoids. Use hemorrhoid cream if your caregiver approves.  If you develop varicose veins, wear support hose. Elevate your feet for 15 minutes, 3-4 times a day. Limit salt in your diet.  Avoid heavy lifting, wear low heal shoes, and practice good posture.  Rest a lot with your legs elevated if you have leg cramps or low back pain.  Visit your dentist if you have not gone during your pregnancy. Use a soft toothbrush to brush your teeth and be gentle when you floss.  A sexual relationship may be continued unless your caregiver directs you otherwise.  Do not travel far distances unless it is absolutely necessary and  only with the approval of your caregiver.  Take prenatal classes to understand, practice, and ask questions about the labor and delivery.  Make a trial run to the hospital.  Pack your hospital bag.  Prepare the baby's nursery.  Continue to go to all your prenatal visits as directed by your caregiver. SEEK MEDICAL CARE IF:  You are unsure if you are in labor or if your water has broken.  You have dizziness.  You have mild pelvic cramps, pelvic pressure, or nagging pain in your abdominal area.  You have persistent nausea, vomiting, or diarrhea.  You have a bad smelling vaginal discharge.  You have pain with urination. SEEK IMMEDIATE MEDICAL CARE IF:   You have a fever.  You are leaking fluid from your vagina.  You have spotting or bleeding from your vagina.  You have severe abdominal cramping or pain.  You have rapid weight loss or gain.  You have shortness of breath with chest pain.  You notice sudden or  extreme swelling of your face, hands, ankles, feet, or legs.  You have not felt your baby move in over an hour.  You have severe headaches that do not go away with medicine.  You have vision changes. Document Released: 09/10/2001 Document Revised: 09/21/2013 Document Reviewed: 11/17/2012 [facility] Patient Information 2015 [place], [place]. This information is not intended to replace advice given to you by your health care provider. Make sure you discuss any questions you have with your health care provider. Fetal Movement Counts Patient Name: __________________________________________________ Patient Due Date: ____________________ Performing a fetal movement count is highly recommended in high-risk pregnancies, but it is good for every pregnant woman to do. Your health care provider may ask you to start counting fetal movements at 28 weeks of the pregnancy. Fetal movements often increase:  After eating a full meal.  After physical activity.  After eating or  drinking something sweet or cold.  At rest. Pay attention to when you feel the baby is most active. This will help you notice a pattern of your baby's sleep and wake cycles and what factors contribute to an increase in fetal movement. It is important to perform a fetal movement count at the same time each day when your baby is normally most active.  HOW TO COUNT FETAL MOVEMENTS  Find a quiet and comfortable area to sit or lie down on your left side. Lying on your left side provides the best blood and oxygen circulation to your baby.  Write down the day and time on a sheet of paper or in a journal.  Start counting kicks, flutters, swishes, rolls, or jabs in a 2-hour period. You should feel at least 10 movements within 2 hours.  If you do not feel 10 movements in 2 hours, wait 2-3 hours and count again. Look for a change in the pattern or not enough counts in 2 hours. SEEK MEDICAL CARE IF:  You feel less than 10 counts in 2 hours, tried twice.  There is no movement in over an hour.  The pattern is changing or taking longer each day to reach 10 counts in 2 hours.  You feel the baby is not moving as he or she usually does. Date: ____________ Movements: ____________ Start time: ____________ Doreatha Martin time: ____________  Date: ____________ Movements: ____________ Start time: ____________ Doreatha Martin time: ____________ Date: ____________ Movements: ____________ Start time: ____________ Doreatha Martin time: ____________ Date: ____________ Movements: ____________ Start time: ____________ Doreatha Martin time: ____________ Date: ____________ Movements: ____________ Start time: ____________ Doreatha Martin time: ____________ Date: ____________ Movements: ____________ Start time: ____________ Doreatha Martin time: ____________ Date: ____________ Movements: ____________ Start time: ____________ Doreatha Martin time: ____________ Date: ____________ Movements: ____________ Start time: ____________ Doreatha Martin time: ____________  Date: ____________  Movements: ____________ Start time: ____________ Doreatha Martin time: ____________ Date: ____________ Movements: ____________ Start time: ____________ Doreatha Martin time: ____________ Date: ____________ Movements: ____________ Start time: ____________ Doreatha Martin time: ____________ Date: ____________ Movements: ____________ Start time: ____________ Doreatha Martin time: ____________ Date: ____________ Movements: ____________ Start time: ____________ Doreatha Martin time: ____________ Date: ____________ Movements: ____________ Start time: ____________ Doreatha Martin time: ____________ Date: ____________ Movements: ____________ Start time: ____________ Doreatha Martin time: ____________  Date: ____________ Movements: ____________ Start time: ____________ Doreatha Martin time: ____________ Date: ____________ Movements: ____________ Start time: ____________ Doreatha Martin time: ____________ Date: ____________ Movements: ____________ Start time: ____________ Doreatha Martin time: ____________ Date: ____________ Movements: ____________ Start time: ____________ Doreatha Martin time: ____________ Date: ____________ Movements: ____________ Start time: ____________ Doreatha Martin time: ____________ Date: ____________ Movements: ____________ Start time: ____________ Doreatha Martin time: ____________ Date: ____________ Movements: ____________ Start time: ____________  Finish time: ____________  Date: ____________ Movements: ____________ Start time: ____________ Doreatha Martin time: ____________ Date: ____________ Movements: ____________ Start time: ____________ Doreatha Martin time: ____________ Date: ____________ Movements: ____________ Start time: ____________ Doreatha Martin time: ____________ Date: ____________ Movements: ____________ Start time: ____________ Doreatha Martin time: ____________ Date: ____________ Movements: ____________ Start time: ____________ Doreatha Martin time: ____________ Date: ____________ Movements: ____________ Start time: ____________ Doreatha Martin time: ____________ Date: ____________ Movements: ____________ Start time:  ____________ Doreatha Martin time: ____________  Date: ____________ Movements: ____________ Start time: ____________ Doreatha Martin time: ____________ Date: ____________ Movements: ____________ Start time: ____________ Doreatha Martin time: ____________ Date: ____________ Movements: ____________ Start time: ____________ Doreatha Martin time: ____________ Date: ____________ Movements: ____________ Start time: ____________ Doreatha Martin time: ____________ Date: ____________ Movements: ____________ Start time: ____________ Doreatha Martin time: ____________ Date: ____________ Movements: ____________ Start time: ____________ Doreatha Martin time: ____________ Date: ____________ Movements: ____________ Start time: ____________ Doreatha Martin time: ____________  Date: ____________ Movements: ____________ Start time: ____________ Doreatha Martin time: ____________ Date: ____________ Movements: ____________ Start time: ____________ Doreatha Martin time: ____________ Date: ____________ Movements: ____________ Start time: ____________ Doreatha Martin time: ____________ Date: ____________ Movements: ____________ Start time: ____________ Doreatha Martin time: ____________ Date: ____________ Movements: ____________ Start time: ____________ Doreatha Martin time: ____________ Date: ____________ Movements: ____________ Start time: ____________ Doreatha Martin time: ____________ Date: ____________ Movements: ____________ Start time: ____________ Doreatha Martin time: ____________  Date: ____________ Movements: ____________ Start time: ____________ Doreatha Martin time: ____________ Date: ____________ Movements: ____________ Start time: ____________ Doreatha Martin time: ____________ Date: ____________ Movements: ____________ Start time: ____________ Doreatha Martin time: ____________ Date: ____________ Movements: ____________ Start time: ____________ Doreatha Martin time: ____________ Date: ____________ Movements: ____________ Start time: ____________ Doreatha Martin time: ____________ Date: ____________ Movements: ____________ Start time: ____________ Doreatha Martin time: ____________ Date:  ____________ Movements: ____________ Start time: ____________ Doreatha Martin time: ____________  Date: ____________ Movements: ____________ Start time: ____________ Doreatha Martin time: ____________ Date: ____________ Movements: ____________ Start time: ____________ Doreatha Martin time: ____________ Date: ____________ Movements: ____________ Start time: ____________ Doreatha Martin time: ____________ Date: ____________ Movements: ____________ Start time: ____________ Doreatha Martin time: ____________ Date: ____________ Movements: ____________ Start time: ____________ Doreatha Martin time: ____________ Date: ____________ Movements: ____________ Start time: ____________ Doreatha Martin time: ____________ Document Released: 10/16/2006 Document Revised: 01/31/2014 Document Reviewed: 07/13/2012 [employer] Patient Information 2015 [employer]. This information is not intended to replace advice given to you by your health care provider. Make sure you discuss any questions you have with your health care provider. Braxton Hicks Contractions Contractions of the uterus can occur throughout pregnancy. Contractions are not always a sign that you are in labor.  WHAT ARE BRAXTON HICKS CONTRACTIONS?  Contractions that occur before labor are called Braxton Hicks contractions, or false labor. Toward the end of pregnancy (32-34 weeks), these contractions can develop more often and may become more forceful. This is not true labor because these contractions do not result in opening (dilatation) and thinning of the cervix. They are sometimes difficult to tell apart from true labor because these contractions can be forceful and people have different pain tolerances. You should not feel embarrassed if you go to the hospital with false labor. Sometimes, the only way to tell if you are in true labor is for your health care provider to look for changes in the cervix. If there are no prenatal problems or other health problems associated with the pregnancy, it is completely safe to be  sent home with false labor and await the onset of true labor. HOW CAN YOU TELL THE DIFFERENCE BETWEEN TRUE AND FALSE LABOR? False Labor  The contractions of false labor are usually shorter and not as hard as those of true labor.  The contractions are usually irregular.   The contractions are often felt in the front of the lower abdomen and in the groin.   The contractions may go away when you walk around or change positions while lying down.   The contractions get weaker and are shorter lasting as time goes on.   The contractions do not usually become progressively stronger, regular, and closer together as with true labor.  True Labor  Contractions in true labor last 30-70 seconds, become very regular, usually become more intense, and increase in frequency.   The contractions do not go away with walking.   The discomfort is usually felt in the top of the uterus and spreads to the lower abdomen and low back.   True labor can be determined by your health care provider with an exam. This will show that the cervix is dilating and getting thinner.  WHAT TO REMEMBER  Keep up with your usual exercises and follow other instructions given by your health care provider.   Take medicines as directed by your health care provider.   Keep your regular prenatal appointments.   Eat and drink lightly if you think you are going into labor.   If Braxton Hicks contractions are making you uncomfortable:   Change your position from lying down or resting to walking, or from walking to resting.   Sit and rest in a tub of warm water.   Drink 2-3 glasses of water. Dehydration may cause these contractions.   Do slow and deep breathing several times an hour.  WHEN SHOULD I SEEK IMMEDIATE MEDICAL CARE? Seek immediate medical care if:  Your contractions become stronger, more regular, and closer together.   You have fluid leaking or gushing from your vagina.   You have a  fever.   You pass blood-tinged mucus.   You have vaginal bleeding.   You have continuous abdominal pain.   You have low back pain that you never had before.   You feel your baby's head pushing down and causing pelvic pressure.   Your baby is not moving as much as it used to.  Document Released: 09/16/2005 Document Revised: 09/21/2013 Document Reviewed: 06/28/2013 [facility] Patient Information 2015 [place], [place]. This information is not intended to replace advice given to you by your health care provider. Make sure you discuss any questions you have with your health care provider.

## 2015-04-30 NOTE — Anesthesia Preprocedure Evaluation (Signed)
Anesthesia Evaluation  Patient identified by MRN, date of birth, ID band Patient awake    Reviewed: Allergy & Precautions, H&P , NPO status , Patient's Chart, lab work & pertinent test results  Airway Mallampati: II  TM Distance: >3 FB Neck ROM: full    Dental no notable dental hx.    Pulmonary    Pulmonary exam normal       Cardiovascular negative cardio ROS Normal cardiovascular exam    Neuro/Psych negative neurological ROS  negative psych ROS   GI/Hepatic negative GI ROS, Neg liver ROS,   Endo/Other  negative endocrine ROS  Renal/GU negative Renal ROS     Musculoskeletal   Abdominal (+) + obese,   Peds  Hematology   Anesthesia Other Findings   Reproductive/Obstetrics (+) Pregnancy                             Anesthesia Physical Anesthesia Plan  ASA: II  Anesthesia Plan: Epidural   Post-op Pain Management:    Induction:   Airway Management Planned:   Additional Equipment:   Intra-op Plan:   Post-operative Plan:   Informed Consent: I have reviewed the patients History and Physical, chart, labs and discussed the procedure including the risks, benefits and alternatives for the proposed anesthesia with the patient or authorized representative who has indicated his/her understanding and acceptance.     Plan Discussed with:   Anesthesia Plan Comments:         Anesthesia Quick Evaluation

## 2015-04-30 NOTE — H&P (Signed)
Pt is a 24 y/o black female, G1P0 at term who presents to the ER in labor. On admission the pt was 3 cm. Pt is +GBS. She has a h.o. Asthma. She had a neg Quad Screen . She has a + sickle screen . PMHX: See hollister' PE: VSSAF         HEENT-wnl         Abd- gravid, palp ctxs         Cx-70/3/-2         FHTs- without decels IMP/ IUP at term in labor         +GBS Plan/ Admit          Start Abxs

## 2015-05-01 ENCOUNTER — Encounter (HOSPITAL_COMMUNITY): Payer: Self-pay | Admitting: *Deleted

## 2015-05-01 DIAGNOSIS — Z3480 Encounter for supervision of other normal pregnancy, unspecified trimester: Secondary | ICD-10-CM

## 2015-05-01 LAB — RPR: RPR Ser Ql: NONREACTIVE

## 2015-05-01 MED ORDER — DIBUCAINE 1 % RE OINT: 1.0000 "application " | TOPICAL_OINTMENT | RECTAL | Status: DC | PRN

## 2015-05-01 MED ORDER — ONDANSETRON HCL 4 MG PO TABS: 4.0000 mg | ORAL_TABLET | ORAL | Status: DC | PRN

## 2015-05-01 MED ORDER — OXYCODONE-ACETAMINOPHEN 5-325 MG PO TABS: 2.0000 | ORAL_TABLET | ORAL | Status: DC | PRN

## 2015-05-01 MED ORDER — ZOLPIDEM TARTRATE 5 MG PO TABS: 5.0000 mg | ORAL_TABLET | Freq: Every evening | ORAL | Status: DC | PRN

## 2015-05-01 MED ORDER — BENZOCAINE-MENTHOL 20-0.5 % EX AERO
1.0000 "application " | INHALATION_SPRAY | CUTANEOUS | Status: DC | PRN
  Administered 2015-05-01: 1 via TOPICAL
  Filled 2015-05-01: qty 56

## 2015-05-01 MED ORDER — TETANUS-DIPHTH-ACELL PERTUSSIS 5-2.5-18.5 LF-MCG/0.5 IM SUSP: 0.5000 mL | Freq: Once | INTRAMUSCULAR | Status: DC

## 2015-05-01 MED ORDER — ACETAMINOPHEN 325 MG PO TABS: 650.0000 mg | ORAL_TABLET | ORAL | Status: DC | PRN

## 2015-05-01 MED ORDER — ONDANSETRON HCL 4 MG/2ML IJ SOLN: 4.0000 mg | INTRAMUSCULAR | Status: DC | PRN

## 2015-05-01 MED ORDER — SENNOSIDES-DOCUSATE SODIUM 8.6-50 MG PO TABS
2.0000 | ORAL_TABLET | ORAL | Status: DC
  Administered 2015-05-01: 2 via ORAL
  Filled 2015-05-01: qty 2

## 2015-05-01 MED ORDER — OXYCODONE-ACETAMINOPHEN 5-325 MG PO TABS
1.0000 | ORAL_TABLET | ORAL | Status: DC | PRN
  Administered 2015-05-01 (×2): 1 via ORAL
  Filled 2015-05-01 (×2): qty 1

## 2015-05-01 MED ORDER — WITCH HAZEL-GLYCERIN EX PADS: 1.0000 "application " | MEDICATED_PAD | CUTANEOUS | Status: DC | PRN

## 2015-05-01 MED ORDER — PNEUMOCOCCAL VAC POLYVALENT 25 MCG/0.5ML IJ INJ
0.5000 mL | INJECTION | INTRAMUSCULAR | Status: AC
  Administered 2015-05-02: 0.5 mL via INTRAMUSCULAR
  Filled 2015-05-01 (×2): qty 0.5

## 2015-05-01 MED ORDER — MEASLES, MUMPS & RUBELLA VAC ~~LOC~~ INJ
0.5000 mL | INJECTION | Freq: Once | SUBCUTANEOUS | Status: DC
  Filled 2015-05-01: qty 0.5

## 2015-05-01 MED ORDER — IBUPROFEN 600 MG PO TABS
600.0000 mg | ORAL_TABLET | Freq: Four times a day (QID) | ORAL | Status: DC
  Administered 2015-05-01 – 2015-05-02 (×6): 600 mg via ORAL
  Filled 2015-05-01 (×6): qty 1

## 2015-05-01 MED ORDER — SIMETHICONE 80 MG PO CHEW: 80.0000 mg | CHEWABLE_TABLET | ORAL | Status: DC | PRN

## 2015-05-01 NOTE — Progress Notes (Signed)
Patient is doing well.  She is ambulating, tolerating PO.  Has not yet voided.  Pain control is good.  Lochia is appropriate  Filed Vitals:   05/01/15 0200 05/01/15 0300 05/01/15 0600 05/01/15 0800  BP: 118/64 110/65 133/62 121/52  Pulse: 78 104 112 64  Temp: 98.1 F (36.7 C) 99.2 F (37.3 C) 99.3 F (37.4 C) 97.8 F (36.6 C)  TempSrc: Oral Oral Oral Oral  Resp: SpO2: 100% 99% 100%     NAD Fundus firm Ext: no edema  Lab Results  Component Value Date   WBC 12.6* 04/30/2015   HGB 10.5* 04/30/2015   HCT 32.7* 04/30/2015   MCV 78.8 04/30/2015   PLT 264 04/30/2015    --/--/A POS (07/31 2000)/RImmune   A/P 24 y.o. G1P1001 PPD#1. Routine care.   Another attempt to void in next 30 minutes.  If no void, will cath.    [facility] GEFFEL [employer]

## 2015-05-01 NOTE — Lactation Note (Signed)
This note was copied from the chart of Cheryl Mcbride. Lactation Consultation Note RN stated mom having difficulty d/t large breast and large nipples, asked for formula. Told staff she was going to breast but now she is going to just bottle feed. I asked mom if was sure that was what she wanted to do and she stated yes. I asked her if she knew about the benefits of BF, she stated yes but her breast are not cut out for it and she was just going to bottle feed. I told her if she changed her mind, wanted to pump, or had questions please let us know. Patient Name: Cheryl Eldoris Beiser [id number] Date: 05/01/2015     Maternal Data    Feeding Feeding Type: Breast Fed Length of feed: 15 min  LATCH Score/Interventions Latch: Grasps breast easily, tongue down, lips flanged, rhythmical sucking.  Audible Swallowing: None Intervention(s): Skin to skin  Type of Nipple: Everted at rest and after stimulation  Comfort (Breast/Nipple): Soft / non-tender     Hold (Positioning): Assistance needed to correctly position infant at breast and maintain latch.  LATCH Score: 7  Lactation Tools Discussed/Used     Consult Status      Keiyana Stehr G 05/01/2015, 5:51 AM

## 2015-05-01 NOTE — Progress Notes (Signed)
UR chart review completed.  

## 2015-05-02 MED ORDER — OXYCODONE-ACETAMINOPHEN 5-325 MG PO TABS: 1.0000 | ORAL_TABLET | ORAL | Status: DC | PRN

## 2015-05-02 MED ORDER — IBUPROFEN 600 MG PO TABS: 600.0000 mg | ORAL_TABLET | Freq: Four times a day (QID) | ORAL | Status: DC | PRN

## 2015-05-02 NOTE — Progress Notes (Signed)
Post Partum Day 2 Subjective: no complaints, up ad lib, voiding, tolerating PO, + flatus and bottle feeding  Objective: Blood pressure 129/70, pulse 79, temperature 98.3 F (36.8 C), temperature source Oral, resp. rate 18, last menstrual period 08/02/2014, SpO2 100 %, unknown if currently breastfeeding.  Physical Exam:  General: alert, cooperative and no distress Lochia: appropriate Uterine Fundus: firm perineum: healing well, no significant drainage, no dehiscence, no significant erythema DVT Evaluation: No evidence of DVT seen on physical exam. Negative Homan's sign. No cords or calf tenderness.   Recent Labs  04/30/15 2000  HGB 10.5*  HCT 32.7*    Assessment/Plan: Discharge home and Contraception will discuss at post partum visit   LOS: 2 days   Cheryl Mcbride STACIA 05/02/2015, 11:13 AM

## 2015-05-02 NOTE — Anesthesia Postprocedure Evaluation (Signed)
2 Anesthesia Post-op Note  Patient: Cheryl Mcbride  Procedure(s) Performed: * No procedures listed *  Patient Location: PACU and Mother/Baby  Anesthesia Type:Epidural  Level of Consciousness: awake, alert  and oriented  Airway and Oxygen Therapy: Patient Spontanous Breathing  Post-op Pain: mild  Post-op Assessment: Patient's Cardiovascular Status Stable, Respiratory Function Stable, No signs of Nausea or vomiting, Adequate PO intake, Pain level controlled, No headache and Patient able to bend at knees              Post-op Vital Signs: Reviewed and stable  Last Vitals:  Filed Vitals:   05/01/15 1815  BP: 124/63  Pulse: 86  Temp: 36.9 C  Resp: 18    Complications: No apparent anesthesia complications

## 2015-05-02 NOTE — Discharge Summary (Signed)
Obstetric Discharge Summary Reason for Admission: onset of labor Prenatal Procedures: none Intrapartum Procedures: spontaneous vaginal delivery Postpartum Procedures: none Complications-Operative and Postpartum: none HEMOGLOBIN  Date Value Ref Range Status  04/30/2015 10.5* 12.0 - 15.0 g/dL Final   HCT  Date Value Ref Range Status  04/30/2015 32.7* 36.0 - 46.0 % Final    Physical Exam:  General: alert, cooperative and no distress Lochia: appropriate Uterine Fundus: firm perineum: healing well, no significant drainage, no dehiscence, no significant erythema DVT Evaluation: No evidence of DVT seen on physical exam. Negative Homan's sign. No cords or calf tenderness.  Discharge Diagnoses: Term Pregnancy-delivered  Discharge Information: Date: 05/02/2015 Activity: pelvic rest Diet: routine Medications: Ibuprofen and Percocet Condition: stable Instructions: refer to practice specific booklet Discharge to: home   Newborn Data: Live born female  Birth Weight: 6 lb 5.9 oz (2890 g) APGAR: 9, 9  Home with mother.  Cheryl Mcbride 05/02/2015, 11:16 AM

## 2015-10-01 NOTE — L&D Delivery Note (Signed)
Delivery Note At 2:43 PM a viable female was delivered via Vaginal, Spontaneous Delivery (Presentation: Left Occiput Anterior).  APGAR: 8, 9; weight  pending.   Placenta status: Intact, Spontaneous.  Cord: 3 vessels with the following complications: nuchal cord x 2, delivered through and reduced with vigorous cry.  Anesthesia: Epidural  Episiotomy: None Lacerations:  1st degree perineal Suture Repair: 3.0 monocryl Est. Blood Loss (mL):  200mL  Mom to postpartum.  Baby to Couplet care / Skin to Skin.  Cheryl Mcbride 04/10/2016, 3:10 PM   Patient is a G2P1001 at 6025w2d who was admitted with SOL, uncomplicated prenatal course.  She progressed without augmentation.  I was gloved and present for delivery in its entirety.  Second stage of labor progressed, baby delivered after two contractions.  Mild decels during second stage noted.  Complications: none  Lacerations: 1st degree perineal  EBL: 200cc  Cam HaiSHAW, Fayrene Towner, CNM 4:03 PM  04/10/2016

## 2015-12-11 LAB — OB RESULTS CONSOLE VARICELLA ZOSTER ANTIBODY, IGG: Varicella: IMMUNE

## 2016-02-06 ENCOUNTER — Encounter (HOSPITAL_COMMUNITY): Payer: Self-pay | Admitting: *Deleted

## 2016-02-06 ENCOUNTER — Inpatient Hospital Stay (HOSPITAL_COMMUNITY)
Admission: AD | Admit: 2016-02-06 | Discharge: 2016-02-06 | Disposition: A | Discharge status: Home / Self Care | Payer: Medicaid Other | Source: Ambulatory Visit | Attending: Obstetrics & Gynecology | Admitting: Obstetrics & Gynecology

## 2016-02-06 DIAGNOSIS — K649 Unspecified hemorrhoids: Secondary | ICD-10-CM

## 2016-02-06 DIAGNOSIS — K5900 Constipation, unspecified: Secondary | ICD-10-CM | POA: Diagnosis not present

## 2016-02-06 DIAGNOSIS — O2243 Hemorrhoids in pregnancy, third trimester: Secondary | ICD-10-CM | POA: Diagnosis not present

## 2016-02-06 DIAGNOSIS — D573 Sickle-cell trait: Secondary | ICD-10-CM | POA: Diagnosis present

## 2016-02-06 DIAGNOSIS — Z3A31 31 weeks gestation of pregnancy: Secondary | ICD-10-CM

## 2016-02-06 LAB — URINALYSIS, ROUTINE W REFLEX MICROSCOPIC
BILIRUBIN URINE: NEGATIVE
Glucose, UA: NEGATIVE mg/dL
HGB URINE DIPSTICK: NEGATIVE
Ketones, ur: NEGATIVE mg/dL
Leukocytes, UA: NEGATIVE
NITRITE: NEGATIVE
PROTEIN: NEGATIVE mg/dL
Specific Gravity, Urine: >1.03 — ABNORMAL HIGH (ref 1.005–1.030)
pH: 5.5 (ref 5.0–8.0)

## 2016-02-06 MED ORDER — DOCUSATE SODIUM 100 MG PO CAPS: 100.0000 mg | ORAL_CAPSULE | Freq: Two times a day (BID) | ORAL | Status: DC | PRN

## 2016-02-06 MED ORDER — POLYETHYLENE GLYCOL 3350 17 G PO PACK: 17.0000 g | PACK | Freq: Every day | ORAL | Status: DC

## 2016-02-06 MED ORDER — HYDROCORTISONE ACE-PRAMOXINE 1-1 % RE FOAM: 1.0000 | Freq: Two times a day (BID) | RECTAL | Status: DC

## 2016-02-06 NOTE — Discharge Instructions (Signed)
Hemorrhoids °Hemorrhoids are puffy (swollen) veins around the rectum or anus. Hemorrhoids can cause pain, itching, bleeding, or irritation. °HOME CARE °· Eat foods with fiber, such as whole grains, beans, nuts, fruits, and vegetables. Ask your doctor about taking products with added fiber in them (fiber supplements).  °· Drink enough fluid to keep your pee (urine) clear or pale yellow. °· Exercise often. °· Go to the bathroom when you have the urge to poop. Do not wait. °· Avoid straining to poop (bowel movement). °· Keep the butt area dry and clean. Use wet toilet paper or moist paper towels. °· Medicated creams and medicine inserted into the anus (anal suppository) may be used or applied as told. °· Only take medicine as told by your doctor. °· Take a warm water bath (sitz bath) for 15-20 minutes to ease pain. Do this 3-4 times a day. °· Place ice packs on the area if it is tender or puffy. Use the ice packs between the warm water baths. °¨ Put ice in a plastic bag. °¨ Place a towel between your skin and the bag. °¨ Leave the ice on for 15-20 minutes, 03-04 times a day. °· Do not use a donut-shaped pillow or sit on the toilet for a long time. °GET HELP RIGHT AWAY IF:  °· You have more pain that is not controlled by treatment or medicine. °· You have bleeding that will not stop. °· You have trouble or are unable to poop (bowel movement). °· You have pain or puffiness outside the area of the hemorrhoids. °MAKE SURE YOU:  °· Understand these instructions. °· Will watch your condition. °· Will get help right away if you are not doing well or get worse. °  °This information is not intended to replace advice given to you by your health care provider. Make sure you discuss any questions you have with your health care provider. °  °Document Released: 06/25/2008 Document Revised: 09/02/2012 Document Reviewed: 07/28/2012 °Elsevier Interactive Patient Education ©2016 [employer] ° °High-Fiber Diet °Fiber, also called  dietary fiber, is a type of carbohydrate found in fruits, vegetables, whole grains, and beans. A high-fiber diet can have many health benefits. Your health care provider may recommend a high-fiber diet to help: °· Prevent constipation. Fiber can make your bowel movements more regular. °· Lower your cholesterol. °· Relieve hemorrhoids, uncomplicated diverticulosis, or irritable bowel syndrome. °· Prevent overeating as part of a weight-loss plan. °· Prevent heart disease, type 2 diabetes, and certain cancers. °WHAT IS MY PLAN? °The recommended daily intake of fiber includes: °· 38 grams for men under age 50. °· 30 grams for men over age 50. °· 25 grams for women under age 50. °· 21 grams for women over age 50. °You can get the recommended daily intake of dietary fiber by eating a variety of fruits, vegetables, grains, and beans. Your health care provider may also recommend a fiber supplement if it is not possible to get enough fiber through your diet. °WHAT DO I NEED TO KNOW ABOUT A HIGH-FIBER DIET? °· Fiber supplements have not been widely studied for their effectiveness, so it is better to get fiber through food sources. °· Always check the fiber content on the nutrition facts label of any prepackaged food. Look for foods that contain at least 5 grams of fiber per serving. °· Ask your dietitian if you have questions about specific foods that are related to your condition, especially if those foods are not listed in the following section. °· Increase your   daily fiber consumption gradually. Increasing your intake of dietary fiber too quickly may cause bloating, cramping, or gas. °· Drink plenty of water. Water helps you to digest fiber. °WHAT FOODS CAN I EAT? °Grains °Whole-grain breads. Multigrain cereal. Oats and oatmeal. Brown rice. Barley. Bulgur wheat. Millet. Bran muffins. Popcorn. Rye wafer crackers. °Vegetables °Sweet potatoes. Spinach. Kale. Artichokes. Cabbage. Broccoli. Green peas. Carrots.  Squash. °Fruits °Berries. Pears. Apples. Oranges. Avocados. Prunes and raisins. Dried figs. °Meats and Other Protein Sources °Navy, kidney, pinto, and soy beans. Split peas. Lentils. Nuts and seeds. °Dairy °Fiber-fortified yogurt. °Beverages °Fiber-fortified soy milk. Fiber-fortified orange juice. °Other °Fiber bars. °The items listed above may not be a complete list of recommended foods or beverages. Contact your dietitian for more options. °WHAT FOODS ARE NOT RECOMMENDED? °Grains °White bread. Pasta made with refined flour. White rice. °Vegetables °Fried potatoes. Canned vegetables. Well-cooked vegetables.  °Fruits °Fruit juice. Cooked, strained fruit. °Meats and Other Protein Sources °Fatty cuts of meat. Fried poultry or fried fish. °Dairy °Milk. Yogurt. Cream cheese. Sour cream. °Beverages °Soft drinks. °Other °Cakes and pastries. Butter and oils. °The items listed above may not be a complete list of foods and beverages to avoid. Contact your dietitian for more information. °WHAT ARE SOME TIPS FOR INCLUDING HIGH-FIBER FOODS IN MY DIET? °· Eat a wide variety of high-fiber foods. °· Make sure that half of all grains consumed each day are whole grains. °· Replace breads and cereals made from refined flour or white flour with whole-grain breads and cereals. °· Replace white rice with brown rice, bulgur wheat, or millet. °· Start the day with a breakfast that is high in fiber, such as a cereal that contains at least 5 grams of fiber per serving. °· Use beans in place of meat in soups, salads, or pasta. °· Eat high-fiber snacks, such as berries, raw vegetables, nuts, or popcorn. °  °This information is not intended to replace advice given to you by your health care provider. Make sure you discuss any questions you have with your health care provider. °  °Document Released: 09/16/2005 Document Revised: 10/07/2014 Document Reviewed: 03/01/2014 °Elsevier Interactive Patient Education ©2016 Elsevier Inc. ° °

## 2016-02-06 NOTE — MAU Note (Signed)
Hemorrhoids bleeding for a week. Some groin pain on L side. Pain worse with movement. Denies LOF or bleeding

## 2016-02-06 NOTE — MAU Provider Note (Signed)
Chief Complaint:  Hemorrhoids   First Provider Initiated Contact with Patient 02/06/16 1657     HPI  Cheryl Mcbride is a 25 y.o. G2P1001 at 60w1dwho presents to maternity admissions reporting bleeding of hemorrhoids with bowel movment.  Using Prep H with some relief, but got worried when she saw the blood.   Does have constipation and hard stools requiring straining.   Also has some LLQ pain near pubic bone with certain movements.. She reports good fetal movement, denies LOF, vaginal bleeding, vaginal itching/burning, urinary symptoms, h/a, dizziness, n/v, diarrhea, constipation or fever/chills.  She denies headache, visual changes or RUQ abdominal pain.  RN Note: Hemorrhoids bleeding for a week. Some groin pain on L side. Pain worse with movement. Denies LOF or bleeding          Past Medical History: Past Medical History  Diagnosis Date  . Anemia   . Asthma     Past obstetric history: OB History  Gravida Para Term Preterm AB SAB TAB Ectopic Multiple Living  0 1    # Outcome Date GA Lbr Len/2nd Weight Sex Delivery Anes PTL Lv  2 Current           1 Term 05/01/15 [redacted]w[redacted]d 06:01 / 01:24 6 lb 5.9 oz (2.89 kg) F Vag-Spont EPI  Y      Past Surgical History: Past Surgical History  Procedure Laterality Date  . Wisdom tooth extraction      Family History: History reviewed. No pertinent family history.  Social History: Social History  Substance Use Topics  . Smoking status: Never Smoker   . Smokeless tobacco: Never Used  . Alcohol Use: No    Allergies: No Known Allergies  Meds:  Prescriptions prior to admission  Medication Sig Dispense Refill Last Dose  . ibuprofen (ADVIL,MOTRIN) 600 MG tablet Take 1 tablet (600 mg total) by mouth every 6 (six) hours as needed for cramping. 60 tablet 4   . oxyCODONE-acetaminophen (PERCOCET/ROXICET) 5-325 MG per tablet Take 1-2 tablets by mouth every 4 (four) hours as needed (for pain scale 4-7). 30 tablet 0   . Prenatal  Vit-Fe Fumarate-FA (PRENATAL MULTIVITAMIN) TABS tablet Take 1 tablet by mouth daily.    Past Month at Unknown time    I have reviewed patient's Past Medical Hx, Surgical Hx, Family Hx, Social Hx, medications and allergies.   ROS:  Review of Systems  Constitutional: Negative for fever and chills.  Respiratory: Negative for shortness of breath.   Gastrointestinal: Positive for constipation. Negative for nausea, vomiting, abdominal pain, diarrhea and rectal pain.  Genitourinary: Negative for vaginal bleeding, vaginal discharge, difficulty urinating and pelvic pain.  Musculoskeletal: Negative for back pain.   Other systems negative  Physical Exam  Patient Vitals for the past 24 hrs:  BP Temp Pulse Resp Height Weight  02/06/16 1544 120/66 mmHg 98 F (36.7 C) 78 18  (1.702 m) 183 lb 12.8 oz (83.371 kg)   Constitutional: Well-developed, well-nourished female in no acute distress.  Cardiovascular: normal rate and rhythm Respiratory: normal effort, clear to auscultation bilaterally GI: Abd soft, non-tender, gravid appropriate for gestational age.   No rebound or guarding. MS: Extremities nontender, no edema, normal ROM Neurologic: Alert and oriented x 4.  GU: Neg CVAT.  Rectal:   Several small external hemorrhoids visible. One with dried blood.  No thrombosis.  No erethema    FHT:  Baseline 140 , moderate variability, accelerations present, no decelerations Contractions: Rare  Labs: Results for orders placed or performed during the hospital encounter of 02/06/16 (from the past 24 hour(s))  Urinalysis, Routine w reflex microscopic ([facility])     Status: Abnormal   Collection Time: 02/06/16  3:50 PM  Result Value Ref Range   Color, Urine YELLOW YELLOW   APPearance CLEAR CLEAR   Specific Gravity, Urine >1.030 (H) 1.005 - 1.030   pH 5.5 5.0 - 8.0   Glucose, UA NEGATIVE NEGATIVE mg/dL   Hgb urine dipstick NEGATIVE NEGATIVE   Bilirubin Urine NEGATIVE NEGATIVE   Ketones, ur  NEGATIVE NEGATIVE mg/dL   Protein, ur NEGATIVE NEGATIVE mg/dL   Nitrite NEGATIVE NEGATIVE   Leukocytes, UA NEGATIVE NEGATIVE   --/--/A POS (07/31 2000)  Imaging:  No results found.  MAU Course/MDM: I have ordered labs and reviewed results.  NST reviewed Discussed hemorrhoid care.  Also discussed constipation and stool softeners  Assessment: Hemorrhoids Bleeding from hemorrhoids  Plan: Discharge home Rx Proctofoam HC Recommend using Prep H as needed Recommend using Tucks pads Rx Colace bid prn for stool softening Rx Miralax PRN for constipation Followup in clinic for prenatal care    Medication List    ASK your doctor about these medications        ibuprofen 600 MG tablet  Commonly known as:  ADVIL,MOTRIN  Take 1 tablet (600 mg total) by mouth every 6 (six) hours as needed for cramping.     oxyCODONE-acetaminophen 5-325 MG tablet  Commonly known as:  PERCOCET/ROXICET  Take 1-2 tablets by mouth every 4 (four) hours as needed (for pain scale 4-7).     prenatal multivitamin Tabs tablet  Take 1 tablet by mouth daily.       Pt stable at time of discharge.  Wynelle BourgeoisMarie Hunt Zajicek CNM, MSN Certified Nurse-Midwife 02/06/2016 5:24 PM

## 2016-03-14 LAB — OB RESULTS CONSOLE GBS: GBS: NEGATIVE

## 2016-04-10 ENCOUNTER — Inpatient Hospital Stay (HOSPITAL_COMMUNITY)
Admission: AD | Admit: 2016-04-10 | Discharge: 2016-04-12 | DRG: 775 | Disposition: A | Discharge status: Home / Self Care | Payer: Medicaid Other | Source: Ambulatory Visit | Attending: Obstetrics & Gynecology | Admitting: Obstetrics & Gynecology

## 2016-04-10 ENCOUNTER — Encounter (HOSPITAL_COMMUNITY): Payer: Self-pay

## 2016-04-10 ENCOUNTER — Inpatient Hospital Stay (HOSPITAL_COMMUNITY): Payer: Medicaid Other | Admitting: Anesthesiology

## 2016-04-10 DIAGNOSIS — J45909 Unspecified asthma, uncomplicated: Secondary | ICD-10-CM | POA: Diagnosis present

## 2016-04-10 DIAGNOSIS — O9902 Anemia complicating childbirth: Secondary | ICD-10-CM | POA: Diagnosis present

## 2016-04-10 DIAGNOSIS — O9952 Diseases of the respiratory system complicating childbirth: Secondary | ICD-10-CM | POA: Diagnosis present

## 2016-04-10 DIAGNOSIS — F1290 Cannabis use, unspecified, uncomplicated: Secondary | ICD-10-CM | POA: Diagnosis present

## 2016-04-10 DIAGNOSIS — D573 Sickle-cell trait: Secondary | ICD-10-CM | POA: Diagnosis present

## 2016-04-10 DIAGNOSIS — IMO0001 Reserved for inherently not codable concepts without codable children: Secondary | ICD-10-CM

## 2016-04-10 DIAGNOSIS — K219 Gastro-esophageal reflux disease without esophagitis: Secondary | ICD-10-CM | POA: Diagnosis present

## 2016-04-10 DIAGNOSIS — O4693 Antepartum hemorrhage, unspecified, third trimester: Secondary | ICD-10-CM | POA: Diagnosis present

## 2016-04-10 DIAGNOSIS — Z3A40 40 weeks gestation of pregnancy: Secondary | ICD-10-CM

## 2016-04-10 DIAGNOSIS — O99324 Drug use complicating childbirth: Secondary | ICD-10-CM | POA: Diagnosis present

## 2016-04-10 DIAGNOSIS — O9962 Diseases of the digestive system complicating childbirth: Secondary | ICD-10-CM | POA: Diagnosis present

## 2016-04-10 DIAGNOSIS — O2243 Hemorrhoids in pregnancy, third trimester: Secondary | ICD-10-CM | POA: Diagnosis present

## 2016-04-10 LAB — COMPREHENSIVE METABOLIC PANEL
ALBUMIN: 3.2 g/dL — AB (ref 3.5–5.0)
ALK PHOS: 80 U/L (ref 38–126)
ALT: 15 U/L (ref 14–54)
AST: 17 U/L (ref 15–41)
Anion gap: 8 (ref 5–15)
BILIRUBIN TOTAL: 0.4 mg/dL (ref 0.3–1.2)
BUN: 5 mg/dL — AB (ref 6–20)
CALCIUM: 8.7 mg/dL — AB (ref 8.9–10.3)
CO2: 22 mmol/L (ref 22–32)
CREATININE: 0.46 mg/dL (ref 0.44–1.00)
Chloride: 106 mmol/L (ref 101–111)
GFR calc Af Amer: >60 mL/min (ref >60)
GFR calc non Af Amer: >60 mL/min (ref >60)
GLUCOSE: 90 mg/dL (ref 65–99)
Potassium: 3.7 mmol/L (ref 3.5–5.1)
SODIUM: 136 mmol/L (ref 135–145)
TOTAL PROTEIN: 7.1 g/dL (ref 6.5–8.1)

## 2016-04-10 LAB — TYPE AND SCREEN
ABO/RH(D): A POS
Antibody Screen: NEGATIVE

## 2016-04-10 LAB — CBC
HEMATOCRIT: 33.9 % — AB (ref 36.0–46.0)
HEMOGLOBIN: 11.5 g/dL — AB (ref 12.0–15.0)
MCH: 27.6 pg (ref 26.0–34.0)
MCHC: 33.9 g/dL (ref 30.0–36.0)
MCV: 81.3 fL (ref 78.0–100.0)
Platelets: 247 10*3/uL (ref 150–400)
RBC: 4.17 MIL/uL (ref 3.87–5.11)
RDW: 13.2 % (ref 11.5–15.5)
WBC: 12.1 10*3/uL — AB (ref 4.0–10.5)

## 2016-04-10 MED ORDER — LIDOCAINE HCL (PF) 1 % IJ SOLN
30.0000 mL | INTRAMUSCULAR | Status: DC | PRN
  Filled 2016-04-10: qty 30

## 2016-04-10 MED ORDER — FENTANYL CITRATE (PF) 100 MCG/2ML IJ SOLN: 100.0000 ug | INTRAMUSCULAR | Status: DC | PRN

## 2016-04-10 MED ORDER — ZOLPIDEM TARTRATE 5 MG PO TABS: 5.0000 mg | ORAL_TABLET | Freq: Every evening | ORAL | Status: DC | PRN

## 2016-04-10 MED ORDER — LACTATED RINGERS IV SOLN: 500.0000 mL | INTRAVENOUS | Status: DC | PRN

## 2016-04-10 MED ORDER — ACETAMINOPHEN 325 MG PO TABS
650.0000 mg | ORAL_TABLET | ORAL | Status: DC | PRN
  Administered 2016-04-10 – 2016-04-11 (×4): 650 mg via ORAL
  Filled 2016-04-10 (×4): qty 2

## 2016-04-10 MED ORDER — PRENATAL MULTIVITAMIN CH
1.0000 | ORAL_TABLET | Freq: Every day | ORAL | Status: DC
  Administered 2016-04-11 – 2016-04-12 (×2): 1 via ORAL
  Filled 2016-04-10 (×2): qty 1

## 2016-04-10 MED ORDER — LACTATED RINGERS IV SOLN
500.0000 mL | Freq: Once | INTRAVENOUS | Status: AC
  Administered 2016-04-10: 500 mL via INTRAVENOUS

## 2016-04-10 MED ORDER — DIPHENHYDRAMINE HCL 25 MG PO CAPS: 25.0000 mg | ORAL_CAPSULE | Freq: Four times a day (QID) | ORAL | Status: DC | PRN

## 2016-04-10 MED ORDER — COCONUT OIL OIL
1.0000 "application " | TOPICAL_OIL | Status: DC | PRN
  Filled 2016-04-10: qty 120

## 2016-04-10 MED ORDER — LACTATED RINGERS IV SOLN
INTRAVENOUS | Status: DC
  Administered 2016-04-10 (×2): via INTRAVENOUS

## 2016-04-10 MED ORDER — EPHEDRINE 5 MG/ML INJ
10.0000 mg | INTRAVENOUS | Status: DC | PRN
  Filled 2016-04-10: qty 2

## 2016-04-10 MED ORDER — IBUPROFEN 600 MG PO TABS
600.0000 mg | ORAL_TABLET | Freq: Four times a day (QID) | ORAL | Status: DC
  Administered 2016-04-10 – 2016-04-12 (×8): 600 mg via ORAL
  Filled 2016-04-10 (×8): qty 1

## 2016-04-10 MED ORDER — OXYCODONE-ACETAMINOPHEN 5-325 MG PO TABS: 1.0000 | ORAL_TABLET | ORAL | Status: DC | PRN

## 2016-04-10 MED ORDER — FENTANYL 2.5 MCG/ML BUPIVACAINE 1/10 % EPIDURAL INFUSION (WH - ANES)
14.0000 mL/h | INTRAMUSCULAR | Status: DC | PRN
  Administered 2016-04-10: 14 mL/h via EPIDURAL
  Filled 2016-04-10: qty 125

## 2016-04-10 MED ORDER — OXYTOCIN BOLUS FROM INFUSION
500.0000 mL | INTRAVENOUS | Status: DC
  Administered 2016-04-10: 500 mL via INTRAVENOUS

## 2016-04-10 MED ORDER — DIBUCAINE 1 % RE OINT: 1.0000 "application " | TOPICAL_OINTMENT | RECTAL | Status: DC | PRN

## 2016-04-10 MED ORDER — SIMETHICONE 80 MG PO CHEW: 80.0000 mg | CHEWABLE_TABLET | ORAL | Status: DC | PRN

## 2016-04-10 MED ORDER — TETANUS-DIPHTH-ACELL PERTUSSIS 5-2.5-18.5 LF-MCG/0.5 IM SUSP
0.5000 mL | Freq: Once | INTRAMUSCULAR | Status: AC
  Administered 2016-04-11: 0.5 mL via INTRAMUSCULAR
  Filled 2016-04-10: qty 0.5

## 2016-04-10 MED ORDER — OXYCODONE-ACETAMINOPHEN 5-325 MG PO TABS: 2.0000 | ORAL_TABLET | ORAL | Status: DC | PRN

## 2016-04-10 MED ORDER — ACETAMINOPHEN 325 MG PO TABS: 650.0000 mg | ORAL_TABLET | ORAL | Status: DC | PRN

## 2016-04-10 MED ORDER — PHENYLEPHRINE 40 MCG/ML (10ML) SYRINGE FOR IV PUSH (FOR BLOOD PRESSURE SUPPORT)
80.0000 ug | PREFILLED_SYRINGE | INTRAVENOUS | Status: DC | PRN
  Filled 2016-04-10: qty 5

## 2016-04-10 MED ORDER — SOD CITRATE-CITRIC ACID 500-334 MG/5ML PO SOLN: 30.0000 mL | ORAL | Status: DC | PRN

## 2016-04-10 MED ORDER — OXYTOCIN 40 UNITS IN LACTATED RINGERS INFUSION - SIMPLE MED
2.5000 [IU]/h | INTRAVENOUS | Status: DC
  Filled 2016-04-10: qty 1000

## 2016-04-10 MED ORDER — SENNOSIDES-DOCUSATE SODIUM 8.6-50 MG PO TABS
2.0000 | ORAL_TABLET | ORAL | Status: DC
  Administered 2016-04-11 (×2): 2 via ORAL
  Filled 2016-04-10 (×2): qty 2

## 2016-04-10 MED ORDER — LIDOCAINE HCL (PF) 1 % IJ SOLN
INTRAMUSCULAR | Status: DC | PRN
  Administered 2016-04-10 (×2): 4 mL via EPIDURAL

## 2016-04-10 MED ORDER — WITCH HAZEL-GLYCERIN EX PADS: 1.0000 "application " | MEDICATED_PAD | CUTANEOUS | Status: DC | PRN

## 2016-04-10 MED ORDER — DIPHENHYDRAMINE HCL 50 MG/ML IJ SOLN: 12.5000 mg | INTRAMUSCULAR | Status: DC | PRN

## 2016-04-10 MED ORDER — ONDANSETRON HCL 4 MG PO TABS: 4.0000 mg | ORAL_TABLET | ORAL | Status: DC | PRN

## 2016-04-10 MED ORDER — LIDOCAINE-EPINEPHRINE (PF) 2 %-1:200000 IJ SOLN: INTRAMUSCULAR | Status: DC | PRN

## 2016-04-10 MED ORDER — PHENYLEPHRINE 40 MCG/ML (10ML) SYRINGE FOR IV PUSH (FOR BLOOD PRESSURE SUPPORT)
80.0000 ug | PREFILLED_SYRINGE | INTRAVENOUS | Status: DC | PRN
  Filled 2016-04-10: qty 10
  Filled 2016-04-10: qty 5

## 2016-04-10 MED ORDER — ONDANSETRON HCL 4 MG/2ML IJ SOLN: 4.0000 mg | Freq: Four times a day (QID) | INTRAMUSCULAR | Status: DC | PRN

## 2016-04-10 MED ORDER — ONDANSETRON HCL 4 MG/2ML IJ SOLN: 4.0000 mg | INTRAMUSCULAR | Status: DC | PRN

## 2016-04-10 MED ORDER — BENZOCAINE-MENTHOL 20-0.5 % EX AERO
1.0000 "application " | INHALATION_SPRAY | CUTANEOUS | Status: DC | PRN
  Administered 2016-04-10: 1 via TOPICAL
  Filled 2016-04-10: qty 56

## 2016-04-10 NOTE — MAU Note (Signed)
Pt c/o UC beginning around 0300 that were every 9 min apart. Reports bleeding beginning at 0600 and UC getting closer together. Denies any leaking of fluid

## 2016-04-10 NOTE — Anesthesia Pain Management Evaluation Note (Signed)
  CRNA Pain Management Visit Note  Patient: Cheryl Mcbride, 25 y.o., female  "Hello I am a member of the anesthesia team at [facility]. We have an anesthesia team available at all times to provide care throughout the hospital, including epidural management and anesthesia for C-section. I don't know your plan for the delivery whether it a natural birth, water birth, IV sedation, nitrous supplementation, doula or epidural, but we want to meet your pain goals."   1.Was your pain managed to your expectations on prior hospitalizations?   Yes   2.What is your expectation for pain management during this hospitalization?     Epidural  3.How can we help you reach that goal? epidural  Record the patient's initial score and the patient's pain goal.   Pain: 3  Pain Goal: 2 [facility] wants you to be able to say your pain was always managed very well.  Axel Meas 04/10/2016

## 2016-04-10 NOTE — Progress Notes (Signed)
LABOR PROGRESS NOTE  Cheryl Mcbride is a 25 y.o. G2P1001 at 277w2d  admitted for vaginal bleeding and SOL.  Subjective: Pt resting comfortably s/p epidural.  Objective: BP 151/130 mmHg  Pulse 77  Temp(Src) 98.6 F (37 C) (Oral)  Resp 18  Ht 5\' 7"  (1.702 m)  Wt 92.08 kg (203 lb)  BMI 31.79 kg/m2  SpO2 100% or  Filed Vitals:   04/10/16 1330 04/10/16 1335 04/10/16 1340 04/10/16 1345  BP: 147/85 141/91  151/130  Pulse: 72 75 76 77  Temp:      TempSrc:      Resp: 18  18 18   Height:      Weight:      SpO2: 100% 100% 99% 100%    Cervical exam: 9/90/+1 by Chanetta Marshallimberlake, MD  Labs: Lab Results  Component Value Date   WBC 12.1* 04/10/2016   HGB 11.5* 04/10/2016   HCT 33.9* 04/10/2016   MCV 81.3 04/10/2016   PLT 247 04/10/2016    Patient Active Problem List   Diagnosis Date Noted  . Active labor at term 04/10/2016  . Sickle cell trait (HCC) 02/06/2016  . Hemorrhoids during pregnancy in third trimester, antepartum 02/06/2016    Assessment / Plan: 25 y.o. G2P1001 at 6277w2d here for vaginal bleeding and SOL.  Labor: progressing without augmentation Fetal Wellbeing:  FSE placed, early and variables with moderate variability Pain Control:  S/p epidural Anticipated MOD:  SVD  Loni MuseKate Marquel Spoto, MD 04/10/2016, 2:22 PM

## 2016-04-10 NOTE — H&P (Signed)
Cheryl Mcbride is a 25 y.o. female G2P1001  pt of [facility] presenting for onset of contractions and heavy vaginal bleeding like a period this morning.  She has not tried any treatments and nothing makes the pain or bleeding better or worse.  The contractions are getting progressively stronger and closer together but the bleeding is unchanged since onset.  She reports good fetal movement, denies LOF, vaginal bleeding, vaginal itching/burning, urinary symptoms, h/a, dizziness, n/v, or fever/chills.     Maternal Medical History:  Reason for admission: Contractions and vaginal bleeding.  Nausea.  Contractions: Frequency: regular.   Duration is approximately 1 minute.   Perceived severity is moderate.    Fetal activity: Perceived fetal activity is normal.   Last perceived fetal movement was within the past hour.    Prenatal complications: no prenatal complications Prenatal Complications - Diabetes: none.    OB History    Gravida Para Term Preterm AB TAB SAB Ectopic Multiple Living   0 1     Past Medical History  Diagnosis Date  . Anemia   . Asthma    Past Surgical History  Procedure Laterality Date  . Wisdom tooth extraction     Family History: family history is not on file. Social History:  reports that she has never smoked. She has never used smokeless tobacco. She reports that she does not drink alcohol or use illicit drugs.   Prenatal Transfer Tool  Maternal Diabetes: No Genetic Screening: Declined Maternal Ultrasounds/Referrals: Normal Fetal Ultrasounds or other Referrals:  None Maternal Substance Abuse:  Yes:  Type: Marijuana Significant Maternal Medications:  None Significant Maternal Lab Results:  Lab values include: Group B Strep negative Other Comments:  + marijuana 12/2015, negative later  Review of Systems  Constitutional: Negative for fever, chills and malaise/fatigue.  Eyes: Negative for blurred vision.  Respiratory: Negative for cough and  shortness of breath.   Cardiovascular: Negative for chest pain.  Gastrointestinal: Positive for abdominal pain. Negative for heartburn, nausea and vomiting.  Genitourinary: Negative for dysuria, urgency and frequency.       Bright red vaginal bleeding requiring a pad like a period   Musculoskeletal: Negative.   Neurological: Negative for dizziness and headaches.  Psychiatric/Behavioral: Negative for depression.    Dilation: 4 Effacement (%): 70 Station: -3 Exam by:: L Leftwich-Kirby CNM Blood pressure 125/69, pulse 82, temperature 98.4 F (36.9 C), temperature source Oral, resp. rate 18, not currently breastfeeding. Maternal Exam:  Uterine Assessment: Contraction strength is mild.  Contraction duration is 70 seconds. Contraction frequency is regular.   Abdomen: Patient reports no abdominal tenderness. Fetal presentation: vertex  Cervix: Cervix evaluated by digital exam.     Fetal Exam Fetal Monitor Review: Mode: ultrasound.   Baseline rate: 130.  Variability: moderate (6-25 bpm).   Pattern: accelerations present and no decelerations.    Fetal State Assessment: Category I - tracings are normal.     Physical Exam  Nursing note and vitals reviewed. Constitutional: She is oriented to person, place, and time. She appears well-developed and well-nourished.  Neck: Normal range of motion.  Cardiovascular: Normal rate, regular rhythm and normal heart sounds.   Respiratory: Effort normal.  GI: Soft.  Musculoskeletal: Normal range of motion.  Neurological: She is alert and oriented to person, place, and time. She has normal reflexes.  Skin: Skin is warm and dry.  Psychiatric: She has a normal mood and affect. Her behavior is normal. Judgment and thought content normal.  Prenatal labs: ABO, Rh: --/--/A POS (07/31 2000) Antibody: NEG (07/31 2000) Rubella:  immune RPR: Non Reactive (07/31 2000)  HBsAg:   neg HIV:   neg GBS: Negative (06/15 0000)    Assessment/Plan: G2P1001 @[redacted]w[redacted]d  1. Active labor at term   2. Vaginal bleeding in pregnancy, third trimester   3.  GBS negative  [facility] Pad counts for bleeding Continuous EFM    LEFTWICH-KIRBY, Seleni Meller 04/10/2016, 10:26 AM

## 2016-04-10 NOTE — Anesthesia Procedure Notes (Signed)
Epidural Patient location during procedure: OB Start time: 04/10/2016 12:49 PM  Staffing Anesthesiologist: Mal AmabileFOSTER, Etherine Mackowiak Performed by: anesthesiologist   Preanesthetic Checklist Completed: patient identified, site marked, surgical consent, pre-op evaluation, timeout performed, IV checked, risks and benefits discussed and monitors and equipment checked  Epidural Patient position: sitting Prep: site prepped and draped and DuraPrep Patient monitoring: continuous pulse ox and blood pressure Approach: midline Location: L4-L5 Injection technique: LOR air  Needle:  Needle type: Tuohy  Needle gauge: 17 G Needle length: 9 cm and 9 Needle insertion depth: 6 cm Catheter type: closed end flexible Catheter size: 19 Gauge Catheter at skin depth: 11 cm Test dose: negative and Other  Assessment Events: blood not aspirated, injection not painful, no injection resistance, negative IV test and no paresthesia  Additional Notes Patient identified. Risks and benefits discussed including failed block, incomplete  Pain control, post dural puncture headache, nerve damage, paralysis, blood pressure Changes, nausea, vomiting, reactions to medications-both toxic and allergic and post Partum back pain. All questions were answered. Patient expressed understanding and wished to proceed. Sterile technique was used throughout procedure. Epidural site was Dressed with sterile barrier dressing. No paresthesias, signs of intravascular injection Or signs of intrathecal spread were encountered.  Patient was more comfortable after the epidural was dosed. Please see RN's note for documentation of vital signs and FHR which are stable.

## 2016-04-10 NOTE — Lactation Note (Signed)
This note was copied from a baby's chart. Lactation Consultation Note  Patient Name: Cheryl Mcbride [id number]'s Date: 04/10/2016 Reason for consult: Initial assessment Baby at 3 hr of life. Mom reports bf is going well. She denies breast or nipple pain. She plans to ebf while in the hospital and start formula when she gets home along with bf. She requested a pump to take home. Given Harmony. Discussed baby behavior, feeding frequency, baby belly size, voids, wt loss, breast changes, and nipple care. Mom stated she can manually express and has spoon in room. Given lactation handouts. Aware of OP services and support group. She will call as needed.      Maternal Data Has patient been taught Hand Expression?: Yes  Feeding Feeding Type: Breast Fed  LATCH Score/Interventions Latch: Repeated attempts needed to sustain latch, nipple held in mouth throughout feeding, stimulation needed to elicit sucking reflex. Intervention(s): Adjust position;Assist with latch  Audible Swallowing: A few with stimulation Intervention(s): Skin to skin;Hand expression  Type of Nipple: Everted at rest and after stimulation  Comfort (Breast/Nipple): Soft / non-tender     Hold (Positioning): Full assist, staff holds infant at breast  LATCH Score: 6  Lactation Tools Discussed/[facility] Pump Review: Setup, frequency, and cleaning;Milk Storage Initiated by:: ES Date initiated:: 04/10/16   Consult Status Consult Status: Follow-up Date: 04/11/16 Follow-up type: In-patient    Rulon Eisenmengerlizabeth E Vertis Scheib 04/10/2016, 6:35 PM

## 2016-04-10 NOTE — Anesthesia Preprocedure Evaluation (Signed)
Anesthesia Evaluation  Patient identified by MRN, date of birth, ID band Patient awake    Reviewed: Allergy & Precautions, NPO status , Patient's Chart, lab work & pertinent test results  Airway Mallampati: III  TM Distance: >3 FB Neck ROM: Full    Dental no notable dental hx. (+) Teeth Intact   Pulmonary asthma ,    Pulmonary exam normal breath sounds clear to auscultation       Cardiovascular negative cardio ROS Normal cardiovascular exam Rhythm:Regular Rate:Normal     Neuro/Psych negative neurological ROS  negative psych ROS   GI/Hepatic Neg liver ROS, GERD  Controlled and Medicated,  Endo/Other  Obesity  Renal/GU negative Renal ROS  negative genitourinary   Musculoskeletal negative musculoskeletal ROS (+)   Abdominal (+) + obese,   Peds  Hematology  (+) anemia ,   Anesthesia Other Findings   Reproductive/Obstetrics (+) Pregnancy                             Anesthesia Physical Anesthesia Plan  ASA: II  Anesthesia Plan: Epidural   Post-op Pain Management:    Induction:   Airway Management Planned: Natural Airway  Additional Equipment:   Intra-op Plan:   Post-operative Plan:   Informed Consent: I have reviewed the patients History and Physical, chart, labs and discussed the procedure including the risks, benefits and alternatives for the proposed anesthesia with the patient or authorized representative who has indicated his/her understanding and acceptance.     Plan Discussed with: Anesthesiologist  Anesthesia Plan Comments:         Anesthesia Quick Evaluation

## 2016-04-11 DIAGNOSIS — D573 Sickle-cell trait: Secondary | ICD-10-CM

## 2016-04-11 DIAGNOSIS — O9952 Diseases of the respiratory system complicating childbirth: Secondary | ICD-10-CM

## 2016-04-11 DIAGNOSIS — O9962 Diseases of the digestive system complicating childbirth: Secondary | ICD-10-CM

## 2016-04-11 DIAGNOSIS — Z3A40 40 weeks gestation of pregnancy: Secondary | ICD-10-CM

## 2016-04-11 DIAGNOSIS — O9902 Anemia complicating childbirth: Secondary | ICD-10-CM

## 2016-04-11 DIAGNOSIS — O2243 Hemorrhoids in pregnancy, third trimester: Secondary | ICD-10-CM

## 2016-04-11 DIAGNOSIS — O99324 Drug use complicating childbirth: Secondary | ICD-10-CM

## 2016-04-11 LAB — RPR: RPR: NONREACTIVE

## 2016-04-11 NOTE — Clinical Social Work Maternal (Addendum)
  CLINICAL SOCIAL WORK MATERNAL/CHILD NOTE  Patient Details  Name: Cheryl Mcbride MRN: [id number] Date of Birth: [birth year]  Date:  04/11/2016  Clinical Social Worker Initiating Note:  Lilly Cove, LCSW MSW Date/ Time Initiated:  04/11/16/1217     Child's Name:  Cheryl Mcbride   Legal Guardian:  Mother   Need for Interpreter:  None   Date of Referral:  04/11/16     Reason for Referral:  Current Substance Use/Substance Use During Pregnancy    Referral Source:  RN   Address:     Phone number:      Household Members:  Minor Children, Significant Other   Natural Supports (not living in the home):  Community, Extended Family, Friends, Immediate Family   Professional Supports: None   Employment: Full-time   Type of Work: [employer]  Tour manager:  Database administrator Resources:  Medicaid   Other Resources:  Physicist, medical , [facility] Cultural/Religious Considerations Which May Impact Care:  None noted  Strengths:  Ability to meet basic needs , Compliance with medical plan , Home prepared for child    Risk Factors/Current Problems:  Substance Use    Cognitive State:  Alert , Linear Thinking    Mood/Affect:  Interested , Comfortable    CSW Assessment: LCSW received multiple consults for hx of substance use, substance use during pregnancy, [place].  LCSW met with MOB alone with child in room. Explained role in hospital, reason for consult, and services while in hospital. MOB was accepting of assessment, but very guarded and limited with providing information. LCSW informed MOB of reason for consult, positive drug screens in March 2017 and April 2017 and hospital drug policy.  MOB did report she used, but reports last use was 6 months ago.She denies all other substances and no other substances used during pregnancy.  Discussed with MOB negative effects of THC use and consequences of CPS involvement with both children in the home as she has an 59 month old as well.    She did not give any other history of information regarding this issue. .   She did not give any other history of information. LCSW explained process of following cord after discharge and if positive for substances, MOB understands and reports she has no concerns.    LCSW reviewed with MOB PPD and any current emotional responses with having baby, labor and delivery, and needs currently. MOB denies. Reports she has all resources she needs at home for baby and looking forward to going home. She reports positive support from FOB and other family members.  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  MOB denies any further needs or concerns at this time.  LCSW made clear no intervention warranted at this time as UDS is negative.  MOB voices understanding.   LCSW will follow cord and follow up with intervention if warranted. She will be applying for [facility] once discharged from hospital.  CSW Plan/Description:  Information/Referral to [employer] , [employer] , Other (Comment) (Will follow cord, if positive will make CPS report.)    Lilly Cove, LCSW 04/11/2016, 12:19 PM

## 2016-04-11 NOTE — Lactation Note (Signed)
This note was copied from a baby's chart. Lactation Consultation Note  Patient Name: Cheryl Zannie CoveKameisha Doutt IONGE'XToday's Date: 04/11/2016 Reason for consult: Follow-up assessment with this mom and term baby, now 5827 hours old. Mom was pumping when I went in the room, but the flanges were opulling her areolas. I decreased her 24 flanges, with the same effect. I then fitted her with 21 flanges, lubricated with coconut oil, and this worked better. Mom expressed only drops of colostrum. I advised mom to increase to 24 floanges if 21'cause any discomfort. Mom shown how to hand express after pumping. Mom' has very large breasts, and need towels under her breast to keep them midline, and pillows for support also. I advised mom to call WIC and let them know she plans to pump and bottle feed, to see if she could get a DEP. Mom said she will use a hand pump, if needed, at home.   Maternal Data    Feeding Feeding Type: Bottle Fed - Formula  LATCH Score/Interventions                      Lactation Tools Discussed/Used Tools: Pump;Flanges Flange Size: 24 (decreased to 21 w coconut oil) Breast pump type: Double-Electric Breast Pump WIC Program: Yes (mom encouraged to call WIC for DEP) Pump Review: Setup, frequency, and cleaning;Milk Storage;Other (comment) (hand expression reviewed) Initiated by:: bedside rn Date initiated:: 04/11/16   Consult Status Consult Status: Follow-up Date: 04/12/16 Follow-up type: In-patient    Cheryl Mcbride, Cheryl Mcbride 04/11/2016, 6:09 PM

## 2016-04-11 NOTE — Progress Notes (Signed)
Post Partum Day 1 Subjective: no complaints, up ad lib, voiding and tolerating PO  Objective: Blood pressure 143/72, pulse 84, temperature 98.2 F (36.8 C), temperature source Oral, resp. rate 18, height 5\' 7"  (1.702 m), weight 203 lb (92.08 kg), SpO2 100 %, unknown if currently breastfeeding.  Physical Exam:  General: alert, cooperative and no distress Lochia: appropriate Uterine Fundus: firm Incision: n/a DVT Evaluation: No evidence of DVT seen on physical exam.   Recent Labs  04/10/16 0915  HGB 11.5*  HCT 33.9*    Assessment/Plan: Plan for discharge tomorrow, Breastfeeding and Contraception nexplanon   LOS: 1 day   LEFTWICH-KIRBY, Keri Tavella 04/11/2016, 8:38 PM

## 2016-04-11 NOTE — Lactation Note (Signed)
This note was copied from a baby's chart. Lactation Consultation Note  Patient Name: Cheryl Mcbride [name] Date: 04/11/2016  Follow up visit made.  Baby is 7619 hours old and has not latched to breast.  This is mom's first time breastfeeding.  She has been giving baby formula per bottle.  Baby recently took bottle.  [facility].   Maternal Data    Feeding    LATCH Score/Interventions                      Lactation Tools Discussed/Used     Consult Status      Huston FoleyMOULDEN, Keir Foland S 04/11/2016, 10:00 AM

## 2016-04-11 NOTE — Anesthesia Postprocedure Evaluation (Signed)
Anesthesia Post Note  Patient: Cheryl Mcbride  Procedure(s) Performed: * No procedures listed *  Patient location during evaluation: Mother Baby Anesthesia Type: Epidural Level of consciousness: awake and alert Pain management: pain level controlled Vital Signs Assessment: post-procedure vital signs reviewed and stable Respiratory status: spontaneous breathing, nonlabored ventilation and respiratory function stable Cardiovascular status: stable Postop Assessment: no headache, no backache, epidural receding and patient able to bend at knees Anesthetic complications: no     Last Vitals:  Filed Vitals:   04/10/16 1815 04/11/16 0508  BP: 128/74 120/65  Pulse: 78 94  Temp: 36.7 C 37.1 C  Resp: 18     Last Pain:  Filed Vitals:   04/11/16 0754  PainSc: 6    Pain Goal: Patients Stated Pain Goal: 4 (04/11/16 0745)               Rica RecordsICKELTON,Rulon Abdalla

## 2016-04-12 MED ORDER — IBUPROFEN 600 MG PO TABS: 600.0000 mg | ORAL_TABLET | Freq: Four times a day (QID) | ORAL | Status: DC

## 2016-04-12 NOTE — Lactation Note (Signed)
This note was copied from a baby's chart. Lactation Consultation Note: mom has been bottle feeding formula. Reports she pumped once yesterday with LC and none since. Reviewed engorgement prevention and treatment. States she will use her hand pump at home. No questions at present. To call prn  Patient Name: Cheryl Mcbride [name] Date: 04/12/2016 Reason for consult: Follow-up assessment   Maternal Data Formula Feeding for Exclusion: Yes Reason for exclusion: Mother's choice to formula and breast feed on admission Does the patient have breastfeeding experience prior to this delivery?: No  Feeding    LATCH Score/Interventions                      Lactation Tools Discussed/Used     Consult Status Consult Status: Complete    Pamelia HoitWeeks, Kla Bily D 04/12/2016, 8:36 AM

## 2016-04-12 NOTE — Discharge Instructions (Signed)

## 2016-04-12 NOTE — Discharge Summary (Signed)
OB Discharge Summary     Patient Name: Cheryl Mcbride DOB: [birth year] MRN: [id number]  Date of admission: 04/10/2016 Delivering MD: Garth BignessIMBERLAKE, KATHRYN   Date of discharge: 04/12/2016  Admitting diagnosis: 40WKS, BLEEDING,CTX Intrauterine pregnancy: 1472w2d     Secondary diagnosis:  Active Problems:   Active labor at term  Additional problems: THC use     Discharge diagnosis: Term Pregnancy Delivered                                                                                                Post partum procedures:none  Augmentation: AROM  Complications: None  Hospital course:  Onset of Labor With Vaginal Delivery     25 y.o. yo [id number] at 4572w2d was admitted in Active Labor on 04/10/2016. Patient had an uncomplicated labor course as follows:  Membrane Rupture Time/Date: 2:12 PM ,04/10/2016   Intrapartum Procedures: Episiotomy: None [1]                                         Lacerations:  1st degree [2];Perineal [11]  Patient had a delivery of a Viable infant. 04/10/2016  Information for the patient's newborn:  Crist Fatnderson, Boy Kitt [[id number]][[id number]]  Delivery Method: Vaginal, Spontaneous Delivery (Filed from Delivery Summary)     Pateint had an uncomplicated postpartum course.  She is ambulating, tolerating a regular diet, passing flatus, and urinating well. Patient is discharged home in stable condition on 04/12/2016.    Physical exam  Filed Vitals:   04/10/16 1815 04/11/16 0508 04/11/16 1745 04/12/16 0527  BP: 128/74 120/65 143/72 107/64  Pulse: 78 94 84 82  Temp: 98.1 F (36.7 C) 98.8 F (37.1 C) 98.2 F (36.8 C) 98 F (36.7 C)  TempSrc: Oral Oral Oral Oral  Resp: 18  18 17   Height:      Weight:      SpO2:       General: alert, cooperative and no distress Lochia: appropriate Uterine Fundus: firm Incision: N/A DVT Evaluation: No evidence of DVT seen on physical exam. Labs: Lab Results  Component Value Date   WBC 12.1* 04/10/2016   HGB 11.5* 04/10/2016    HCT 33.9* 04/10/2016   MCV 81.3 04/10/2016   PLT 247 04/10/2016   CMP Latest Ref Rng 04/10/2016  Glucose 65 - 99 mg/dL 90  BUN 6 - 20 mg/dL 5(L)  Creatinine 6.570.44 - 1.00 mg/dL 8.460.46  Sodium 962135 - 952145 mmol/L 136  Potassium 3.5 - 5.1 mmol/L 3.7  Chloride 101 - 111 mmol/L 106  CO2 22 - 32 mmol/L 22  Calcium 8.9 - 10.3 mg/dL 8.4(X8.7(L)  Total Protein 6.5 - 8.1 g/dL 7.1  Total Bilirubin 0.3 - 1.2 mg/dL 0.4  Alkaline Phos 38 - 126 U/L 80  AST 15 - 41 U/L 17  ALT 14 - 54 U/L 15    Discharge instruction: per After Visit Summary and "Baby and Me Booklet".  After visit meds:    Medication List    STOP taking these medications  docusate sodium 100 MG capsule  Commonly known as:  COLACE     hydrocortisone-pramoxine rectal foam  Commonly known as:  PROCTOFOAM HC     polyethylene glycol packet  Commonly known as:  MIRALAX      TAKE these medications        ibuprofen 600 MG tablet  Commonly known as:  ADVIL,MOTRIN  Take 1 tablet (600 mg total) by mouth every 6 (six) hours.     prenatal multivitamin Tabs tablet  Take 1 tablet by mouth daily.        Diet: routine diet  Activity: Advance as tolerated. Pelvic rest for 6 weeks.   Outpatient follow up:6 weeks Follow up Appt:No future appointments. Follow up Visit:No Follow-up on file.  Postpartum contraception: Nexplanon  Newborn Data: Live born female  Birth Weight: 6 lb 10.4 oz (3015 g) APGAR: 8, 9  Baby Feeding: Bottle and Breast Disposition:home with mother   04/12/2016 Greig Right, CNM

## 2018-02-14 ENCOUNTER — Emergency Department (HOSPITAL_COMMUNITY)
Admission: EM | Admit: 2018-02-14 | Discharge: 2018-02-14 | Disposition: A | Discharge status: Home / Self Care | Payer: Self-pay | Attending: Emergency Medicine | Admitting: Emergency Medicine

## 2018-02-14 ENCOUNTER — Encounter (HOSPITAL_COMMUNITY): Payer: Self-pay | Admitting: Emergency Medicine

## 2018-02-14 DIAGNOSIS — S39012A Strain of muscle, fascia and tendon of lower back, initial encounter: Secondary | ICD-10-CM

## 2018-02-14 DIAGNOSIS — Y939 Activity, unspecified: Secondary | ICD-10-CM | POA: Insufficient documentation

## 2018-02-14 DIAGNOSIS — M62830 Muscle spasm of back: Secondary | ICD-10-CM

## 2018-02-14 DIAGNOSIS — Y999 Unspecified external cause status: Secondary | ICD-10-CM | POA: Insufficient documentation

## 2018-02-14 DIAGNOSIS — Y92410 Unspecified street and highway as the place of occurrence of the external cause: Secondary | ICD-10-CM | POA: Insufficient documentation

## 2018-02-14 DIAGNOSIS — Z79899 Other long term (current) drug therapy: Secondary | ICD-10-CM | POA: Insufficient documentation

## 2018-02-14 MED ORDER — CYCLOBENZAPRINE HCL 10 MG PO TABS: 10.0000 mg | ORAL_TABLET | Freq: Three times a day (TID) | ORAL | 0 refills | Status: DC | PRN

## 2018-02-14 MED ORDER — NAPROXEN 500 MG PO TABS: 500.0000 mg | ORAL_TABLET | Freq: Two times a day (BID) | ORAL | 0 refills | Status: DC | PRN

## 2018-02-14 NOTE — Discharge Instructions (Signed)
Take naprosyn as directed for inflammation and pain with tylenol for breakthrough pain and flexeril for muscle relaxation. Do not drive or operate machinery with muscle relaxant use. Ice to areas of soreness for the next 24 hours and then may move to heat, no more than 20 minutes at a time every hour for each. Expect to be sore for the next few days and follow up with primary care physician for recheck of ongoing symptoms in the next 1-2 weeks. Return to ER for emergent changing or worsening of symptoms.  °  °
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
## 2019-06-10 ENCOUNTER — Other Ambulatory Visit: Payer: Self-pay

## 2019-06-10 DIAGNOSIS — Z20822 Contact with and (suspected) exposure to covid-19: Secondary | ICD-10-CM

## 2019-06-11 LAB — NOVEL CORONAVIRUS, NAA: SARS-CoV-2, NAA: NOT DETECTED

## 2023-01-21 ENCOUNTER — Ambulatory Visit
Admission: EM | Admit: 2023-01-21 | Discharge: 2023-01-21 | Disposition: A | Discharge status: Home / Self Care | Payer: Managed Care, Other (non HMO) | Attending: Internal Medicine | Admitting: Internal Medicine

## 2023-01-21 DIAGNOSIS — J020 Streptococcal pharyngitis: Secondary | ICD-10-CM | POA: Diagnosis not present

## 2023-01-21 LAB — POCT RAPID STREP A (OFFICE): Rapid Strep A Screen: POSITIVE — AB

## 2023-01-21 MED ORDER — IBUPROFEN 600 MG PO TABS: 600.0000 mg | ORAL_TABLET | Freq: Four times a day (QID) | ORAL | 0 refills | Status: DC | PRN

## 2023-01-21 MED ORDER — AMOXICILLIN 875 MG PO TABS: 875.0000 mg | ORAL_TABLET | Freq: Two times a day (BID) | ORAL | 0 refills | Status: DC

## 2023-01-21 NOTE — ED Triage Notes (Signed)
Pt c/o sore throat x 3 days-NAD-steady gait-tylenol  just PTA

## 2023-01-21 NOTE — ED Provider Notes (Signed)
[facility]  Note:  This document was prepared using Conservation officer, historic buildings and may include unintentional dictation errors.  MRN: [id number] DOB: [birth year]  Subjective:   Cheryl Mcbride is a 32 y.o. female presenting for 3 day history of throat pain, painful swallowing, fever.   No current facility-administered medications for this encounter.  Current Outpatient Medications:    cyclobenzaprine (FLEXERIL) 10 MG tablet, Take 1 tablet (10 mg total) by mouth 3 (three) times daily as needed for muscle spasms., Disp: 15 tablet, Rfl: 0   ibuprofen (ADVIL,MOTRIN) 600 MG tablet, Take 1 tablet (600 mg total) by mouth every 6 (six) hours., Disp: 30 tablet, Rfl: 0   naproxen (NAPROSYN) 500 MG tablet, Take 1 tablet (500 mg total) by mouth 2 (two) times daily as needed for mild pain, moderate pain or headache (TAKE WITH MEALS.)., Disp: 20 tablet, Rfl: 0   Prenatal Vit-Fe Fumarate-FA (PRENATAL MULTIVITAMIN) TABS tablet, Take 1 tablet by mouth daily. , Disp: , Rfl:    No Known Allergies  Past Medical History:  Diagnosis Date   Anemia    Asthma      Past Surgical History:  Procedure Laterality Date   WISDOM TOOTH EXTRACTION      No family history on file.  Social History   Tobacco Use   Smoking status: Never   Smokeless tobacco: Never  Vaping Use   Vaping Use: Never used  Substance Use Topics   Alcohol use: No   Drug use: Yes    Types: Marijuana    ROS   Objective:   Vitals: BP 127/79 (BP Location: Left Arm)   Pulse 89   Temp (!) 100.5 F (38.1 C) (Oral)   Resp 20   LMP 01/07/2023   SpO2 97%   Physical Exam Constitutional:      General: She is not in acute distress.    Appearance: Normal appearance. She is well-developed. She is not ill-appearing, toxic-appearing or diaphoretic.  HENT:     Head: Normocephalic and atraumatic.     Nose: Nose normal.     Mouth/Throat:     Mouth: Mucous membranes are moist.     Pharynx:  Pharyngeal swelling, oropharyngeal exudate and posterior oropharyngeal erythema present. No uvula swelling.     Tonsils: Tonsillar exudate present. No tonsillar abscesses. 1+ on the right. 1+ on the left.  Eyes:     General: No scleral icterus.       Right eye: No discharge.        Left eye: No discharge.     Extraocular Movements: Extraocular movements intact.  Cardiovascular:     Rate and Rhythm: Normal rate.  Pulmonary:     Effort: Pulmonary effort is normal.  Skin:    General: Skin is warm and dry.  Neurological:     General: No focal deficit present.     Mental Status: She is alert and oriented to person, place, and time.  Psychiatric:        Mood and Affect: Mood normal.        Behavior: Behavior normal.     Results for orders placed or performed during the hospital encounter of 01/21/23 (from the past 24 hour(s))  POCT rapid strep A     Status: Abnormal   Collection Time: 01/21/23  7:00 PM  Result Value Ref Range   Rapid Strep A Screen Positive (A) Negative    Assessment and Plan :   PDMP not reviewed this encounter.  1. Strep pharyngitis    Will treat for strep pharyngitis.  Patient is to start amoxicillin, use supportive care otherwise. Counseled patient on potential for adverse effects with medications prescribed/recommended today, ER and return-to-clinic precautions discussed, patient verbalized understanding.     Wallis Bamberg, [place] 01/21/23 1902

## 2023-04-16 ENCOUNTER — Encounter: Payer: Self-pay | Admitting: Family

## 2023-04-16 ENCOUNTER — Ambulatory Visit: Payer: Managed Care, Other (non HMO) | Admitting: Family

## 2023-04-16 VITALS — BP 105/48 | HR 72 | Temp 98.2°F | Ht 67.0 in | Wt 166.6 lb

## 2023-04-16 DIAGNOSIS — L2082 Flexural eczema: Secondary | ICD-10-CM | POA: Diagnosis not present

## 2023-04-16 DIAGNOSIS — M5416 Radiculopathy, lumbar region: Secondary | ICD-10-CM

## 2023-04-16 DIAGNOSIS — M542 Cervicalgia: Secondary | ICD-10-CM

## 2023-04-16 MED ORDER — TRIAMCINOLONE ACETONIDE 0.5 % EX OINT
1.0000 | TOPICAL_OINTMENT | Freq: Two times a day (BID) | CUTANEOUS | 1 refills | Status: AC
Start: 2023-04-16 — End: ?

## 2023-04-16 MED ORDER — NAPROXEN 500 MG PO TABS
500.0000 mg | ORAL_TABLET | Freq: Two times a day (BID) | ORAL | 0 refills | Status: AC
Start: 2023-04-16 — End: ?

## 2023-04-16 NOTE — Assessment & Plan Note (Signed)
chronic patch behind right knee and left upper arm sending Triamcinolone 0.5% ointment, advised on use & SE f/u prn

## 2023-04-16 NOTE — Patient Instructions (Signed)
Welcome to [facility], It was a pleasure meeting you today!    As discussed, I have sent a steroid cream for your Eczema and an anti-inflammatory medication to help your neck & back pain to your pharmacy.  I have sent a referral to our Sports medicine office to further evaluate your pain.      PLEASE NOTE: If you had any LAB tests please let us know if you have not heard back within a few days. You may see your results on MyChart before we have a chance to review them but we will give you a call once they are reviewed by [place]. If we ordered any REFERRALS today, please let us know if you have not heard from their office within the next week.  Let us know through MyChart if you are needing REFILLS, or have your pharmacy send [place] the request. You can also use MyChart to communicate with me or any office staff.  Please try these tips to maintain a healthy lifestyle: It is important that you exercise regularly at least 30 minutes 5 times a week. Think about what you will eat, plan ahead. Choose whole foods, & think  "clean, green, fresh or frozen" over canned, processed or packaged foods which are more sugary, salty, and fatty. 70 to 75% of food eaten should be fresh vegetables and protein. 2-3  meals daily with healthy snacks between meals, but must be whole fruit, protein or vegetables. Aim to eat over a 10 hour period when you are active, for example, 7am to 5pm, and then STOP after your last meal of the day, drinking only water.  Shorter eating windows, 6-8 hours, are showing benefits in heart disease and blood sugar regulation. Drink water every day! Shoot for 64 ounces daily = 8 cups, no other drink is as healthy! Fruit juice is best enjoyed in a healthy way, by EATING the fruit.

## 2023-04-16 NOTE — Progress Notes (Signed)
New Patient Office Visit  Subjective:  Patient ID: Cheryl Mcbride, female    DOB: [birth year]  Age: 32 y.o. MRN: [id number]  CC:  Chief Complaint  Patient presents with   New Patient (Initial Visit)   Eczema    Pt c/o eczema flare ups. Has tried a cream but does not remember the name.    Back Pain    Pt c/o lower and upper back/neck pain, Has tried a back brace which did not help.     HPI Cheryl Mcbride presents for establishing care today.  Neck/back pain:  lumbar pain on both sides of her spine and sometimes pain & tingling down her left leg, sometimes right.  Denies any previous injury. Works as a Lawyer with lifting, pulling, etc. Neck pain in middle of posterior neck and down upper traps bilaterally.   Eczema:  has on back of right knee and left upper arm. Has only tried her niece's cream and it helped, but doesn't remember the name.    Assessment & Plan:  Flexural eczema Assessment & Plan: chronic patch behind right knee and left upper arm sending Triamcinolone 0.5% ointment, advised on use & SE f/u prn  Orders: -     Triamcinolone Acetonide; Apply 1 Application topically 2 (two) times daily.  Dispense: 30 g; Refill: 1  Cervicalgia sending referral to sports med to further eval, r/o stenosis or mild herniation, sending Naproxen, advised on use & SE, advised on keeping neck in proper alignment as much as possible, can apply heat or ice up to tid prn or OTC analgesic patches/creams if helpful.  -     Naproxen; Take 1 tablet (500 mg total) by mouth 2 (two) times daily with a meal. As needed for neck or back pain.  Dispense: 30 tablet; Refill: 0 -     Ambulatory referral to Sports Medicine  Lumbar back pain with radiculopathy affecting lower extremity- sending referral to sports med to further eval, r/o stenosis or mild herniation, sending Naproxen, advised on use & SE, can also apply heat or ice up to tid prn or OTC analgesic patches/creams if  helpful. Advised on proper body mechanics when working with lifting, pulling, pushing.  -     Naproxen; Take 1 tablet (500 mg total) by mouth 2 (two) times daily with a meal. As needed for neck or back pain.  Dispense: 30 tablet; Refill: 0 -     Ambulatory referral to Sports Medicine   Subjective:    Outpatient Medications Prior to Visit  Medication Sig Dispense Refill   albuterol (VENTOLIN HFA) 108 (90 Base) MCG/ACT inhaler Inhale into the lungs every 6 (six) hours as needed for wheezing or shortness of breath.     amoxicillin (AMOXIL) 875 MG tablet Take 1 tablet (875 mg total) by mouth 2 (two) times daily. 20 tablet 0   cyclobenzaprine (FLEXERIL) 10 MG tablet Take 1 tablet (10 mg total) by mouth 3 (three) times daily as needed for muscle spasms. 15 tablet 0   ibuprofen (ADVIL) 600 MG tablet Take 1 tablet (600 mg total) by mouth every 6 (six) hours as needed. 30 tablet 0   Prenatal Vit-Fe Fumarate-FA (PRENATAL MULTIVITAMIN) TABS tablet Take 1 tablet by mouth daily.      No facility-administered medications prior to visit.   Past Medical History:  Diagnosis Date   Anemia    Asthma    Past Surgical History:  Procedure Laterality Date   WISDOM TOOTH EXTRACTION  WISDOM TOOTH EXTRACTION      Objective:   Today's Vitals: BP (!) 105/48 (BP Location: Left Arm, Patient Position: Sitting, Cuff Size: Normal)   Pulse 72   Temp 98.2 F (36.8 C) (Temporal)   Ht 5\' 7"  (1.702 m)   Wt 166 lb 9.6 oz (75.6 kg)   LMP 03/24/2023 (Exact Date)   SpO2 99%   Breastfeeding No   BMI 26.09 kg/m   Physical Exam Vitals and nursing note reviewed.  Constitutional:      Appearance: Normal appearance.  Cardiovascular:     Rate and Rhythm: Normal rate and regular rhythm.  Pulmonary:     Effort: Pulmonary effort is normal.     Breath sounds: Normal breath sounds.  Musculoskeletal:        General: Normal range of motion.     Cervical back: Full passive range of motion without pain. No edema,  rigidity or crepitus.     Lumbar back: No swelling, deformity, tenderness or bony tenderness. Normal range of motion.  Skin:    General: Skin is warm and dry.     Findings: Rash (posterior right knee & left upper arm with erythema, scaling) present. Rash is scaling.  Neurological:     Mental Status: She is alert.  Psychiatric:        Mood and Affect: Mood normal.        Behavior: Behavior normal.    Meds ordered this encounter  Medications   triamcinolone ointment (KENALOG) 0.5 %    Sig: Apply 1 Application topically 2 (two) times daily.    Dispense:  30 g    Refill:  1    Order Specific Question:   Supervising Provider    Answer:   ANDY, CAMILLE L [[id number]]   naproxen (NAPROSYN) 500 MG tablet    Sig: Take 1 tablet (500 mg total) by mouth 2 (two) times daily with a meal. As needed for neck or back pain.    Dispense:  30 tablet    Refill:  0    Order Specific Question:   Supervising Provider    Answer:   ANDY, CAMILLE L [[id number]]    Dulce Sellar, NP

## 2023-04-18 NOTE — Progress Notes (Unsigned)
    [name] D.[name] [address] [place] [address] Phone: [phone]   Assessment and Plan:     There are no diagnoses linked to this encounter.  ***   Pertinent previous records reviewed include ***   Follow Up: ***     Subjective:   I, Cheryl Mcbride, am serving as a Neurosurgeon for Doctor Richardean Sale  Chief Complaint: low back pain   HPI:   04/21/2023 Patient is a 32 year old female complaining of low back pain. Patient states  Relevant Historical Information: ***  Additional pertinent review of systems negative.   Current Outpatient Medications:    albuterol (VENTOLIN HFA) 108 (90 Base) MCG/ACT inhaler, Inhale into the lungs every 6 (six) hours as needed for wheezing or shortness of breath., Disp: , Rfl:    naproxen (NAPROSYN) 500 MG tablet, Take 1 tablet (500 mg total) by mouth 2 (two) times daily with a meal. As needed for neck or back pain., Disp: 30 tablet, Rfl: 0   triamcinolone ointment (KENALOG) 0.5 %, Apply 1 Application topically 2 (two) times daily., Disp: 30 g, Rfl: 1   Objective:     There were no vitals filed for this visit.    There is no height or weight on file to calculate BMI.    Physical Exam:    ***   Electronically signed by:  [name] [name] 7:19 AM 04/18/23

## 2023-04-21 ENCOUNTER — Ambulatory Visit (INDEPENDENT_AMBULATORY_CARE_PROVIDER_SITE_OTHER): Payer: Managed Care, Other (non HMO)

## 2023-04-21 ENCOUNTER — Ambulatory Visit (INDEPENDENT_AMBULATORY_CARE_PROVIDER_SITE_OTHER): Payer: Managed Care, Other (non HMO) | Admitting: Sports Medicine

## 2023-04-21 VITALS — BP 110/82 | HR 50 | Ht 67.0 in | Wt 166.0 lb

## 2023-04-21 DIAGNOSIS — M5442 Lumbago with sciatica, left side: Secondary | ICD-10-CM | POA: Diagnosis not present

## 2023-04-21 DIAGNOSIS — M545 Low back pain, unspecified: Secondary | ICD-10-CM

## 2023-04-21 DIAGNOSIS — M542 Cervicalgia: Secondary | ICD-10-CM | POA: Diagnosis not present

## 2023-04-21 DIAGNOSIS — G8929 Other chronic pain: Secondary | ICD-10-CM | POA: Diagnosis not present

## 2023-04-21 MED ORDER — MELOXICAM 15 MG PO TABS: 15.0000 mg | ORAL_TABLET | Freq: Every day | ORAL | 0 refills | Status: AC

## 2023-04-21 NOTE — Patient Instructions (Addendum)
-   Start meloxicam 15 mg daily x2 weeks.  If still having pain after 2 weeks, complete 3rd-week of meloxicam. May use remaining meloxicam as needed once daily for pain control.  Do not to use additional NSAIDs while taking meloxicam.  May use Tylenol [id number] mg 2 to 3 times a day for breakthrough pain. Low back HEP  PT referral  Recommend using a supportive bra when at work  4 week follow up

## 2023-05-06 ENCOUNTER — Other Ambulatory Visit (HOSPITAL_COMMUNITY)
Admission: RE | Admit: 2023-05-06 | Discharge: 2023-05-06 | Disposition: A | Discharge status: Home / Self Care | Payer: Managed Care, Other (non HMO) | Source: Ambulatory Visit | Attending: Family | Admitting: Family

## 2023-05-06 ENCOUNTER — Ambulatory Visit: Payer: Managed Care, Other (non HMO) | Admitting: Family

## 2023-05-06 ENCOUNTER — Encounter: Payer: Self-pay | Admitting: Family

## 2023-05-06 VITALS — BP 123/73 | HR 76 | Temp 97.8°F | Ht 67.0 in | Wt 161.4 lb

## 2023-05-06 DIAGNOSIS — Z1159 Encounter for screening for other viral diseases: Secondary | ICD-10-CM | POA: Diagnosis not present

## 2023-05-06 DIAGNOSIS — Z113 Encounter for screening for infections with a predominantly sexual mode of transmission: Secondary | ICD-10-CM

## 2023-05-06 DIAGNOSIS — Z124 Encounter for screening for malignant neoplasm of cervix: Secondary | ICD-10-CM | POA: Diagnosis not present

## 2023-05-06 NOTE — Progress Notes (Signed)
   Patient ID: Cheryl Mcbride, female    DOB: May 30, 1991, 32 y.o.   MRN: [id number]  Chief Complaint  Patient presents with   Gynecologic Exam   HPI: PAP smear encounter:  last PAP about 59yrs ago, normal. Pt denies any vaginal concerns today. Would like to add STD testing.   Assessment & Plan:  Need for hepatitis C screening test -     Hepatitis C antibody  Encounter for Pap smear of cervix with HPV DNA cotesting -     Cytology - PAP  Screen for STD (sexually transmitted disease) -     Cytology - PAP   Subjective:    Outpatient Medications Prior to Visit  Medication Sig Dispense Refill   albuterol (VENTOLIN HFA) 108 (90 Base) MCG/ACT inhaler Inhale into the lungs every 6 (six) hours as needed for wheezing or shortness of breath.     meloxicam (MOBIC) 15 MG tablet Take 1 tablet (15 mg total) by mouth daily. 30 tablet 0   naproxen (NAPROSYN) 500 MG tablet Take 1 tablet (500 mg total) by mouth 2 (two) times daily with a meal. As needed for neck or back pain. 30 tablet 0   triamcinolone ointment (KENALOG) 0.5 % Apply 1 Application topically 2 (two) times daily. 30 g 1   No facility-administered medications prior to visit.   Past Medical History:  Diagnosis Date   Anemia    Asthma    Past Surgical History:  Procedure Laterality Date   WISDOM TOOTH EXTRACTION     WISDOM TOOTH EXTRACTION     No Known Allergies    Objective:    Physical Exam Vitals and nursing note reviewed. Exam conducted with a chaperone present.  Constitutional:      Appearance: Normal appearance.  Cardiovascular:     Rate and Rhythm: Normal rate and regular rhythm.  Pulmonary:     Effort: Pulmonary effort is normal.     Breath sounds: Normal breath sounds.  Genitourinary:    Exam position: Lithotomy position.     Pubic Area: No rash or pubic lice.      Labia:        Right: No rash.        Left: No rash.      Vagina: Normal.     Cervix: Normal.     Comments: PAP smear specimen obtained for  testing. Musculoskeletal:        General: Normal range of motion.  Skin:    General: Skin is warm and dry.  Neurological:     Mental Status: She is alert.  Psychiatric:        Mood and Affect: Mood normal.        Behavior: Behavior normal.    BP 123/73   Pulse 76   Temp 97.8 F (36.6 C) (Temporal)   Ht 5\' 7"  (1.702 m)   Wt 161 lb 6.4 oz (73.2 kg)   LMP 04/19/2023 (Exact Date)   SpO2 100%   BMI 25.28 kg/m  Wt Readings from Last 3 Encounters:  05/06/23 161 lb 6.4 oz (73.2 kg)  04/21/23 166 lb (75.3 kg)  04/16/23 166 lb 9.6 oz (75.6 kg)      Dulce Sellar, NP

## 2023-05-15 ENCOUNTER — Ambulatory Visit: Payer: Managed Care, Other (non HMO) | Admitting: Sports Medicine

## 2024-05-04 ENCOUNTER — Ambulatory Visit (INDEPENDENT_AMBULATORY_CARE_PROVIDER_SITE_OTHER)

## 2024-05-04 ENCOUNTER — Ambulatory Visit
Admission: EM | Admit: 2024-05-04 | Discharge: 2024-05-04 | Disposition: A | Discharge status: Home / Self Care | Attending: Family Medicine | Admitting: Family Medicine

## 2024-05-04 ENCOUNTER — Ambulatory Visit: Payer: Self-pay | Admitting: Nurse Practitioner

## 2024-05-04 DIAGNOSIS — R109 Unspecified abdominal pain: Secondary | ICD-10-CM | POA: Diagnosis not present

## 2024-05-04 DIAGNOSIS — R300 Dysuria: Secondary | ICD-10-CM

## 2024-05-04 HISTORY — DX: Dermatitis, unspecified: L30.9

## 2024-05-04 LAB — POCT URINE DIPSTICK
Bilirubin, UA: NEGATIVE
Blood, UA: NEGATIVE
Glucose, UA: NEGATIVE mg/dL
Leukocytes, UA: NEGATIVE
Nitrite, UA: NEGATIVE
POC PROTEIN,UA: NEGATIVE
Spec Grav, UA: 1.015 (ref 1.010–1.025)
Urobilinogen, UA: 1 U/dL
pH, UA: 7 (ref 5.0–8.0)

## 2024-05-04 LAB — POCT URINE PREGNANCY: Preg Test, Ur: NEGATIVE

## 2024-05-04 NOTE — ED Provider Notes (Signed)
 [facility]    CSN: [id number] Arrival date & time: 05/04/24  1722      History   Chief Complaint Chief Complaint  Patient presents with   bladder pain    HPI Cheryl Mcbride is a 33 y.o. female with a past medical history of asthma and eczema presents for abdominal pain.  Patient reports 2 days of a lower abdominal pain that does not radiate.  She states that she has had some nausea without vomiting.  She thinks she has had some dysuria and frequency but no hematuria.  Endorses chills but no fevers.  Has had some nonbloody diarrhea.  Also feels bloated/gassy.  Reports history of constipation.  No vaginal discharge or STD concern.  No GI history of Crohn's, IBS, colitis, diverticulitis.  Noted abdominal surgeries in the past.  She has taken over-the-counter ibuprofen  and Tylenol  with improvement in symptoms.  She also states she feels her symptoms are improving since it began 2 days ago.  No other concerns at this time.  HPI  Past Medical History:  Diagnosis Date   Anemia    Asthma    Eczema     Patient Active Problem List   Diagnosis Date Noted   Flexural eczema 04/16/2023   Sickle cell trait (HCC) 02/06/2016    Past Surgical History:  Procedure Laterality Date   WISDOM TOOTH EXTRACTION     WISDOM TOOTH EXTRACTION      OB History     Gravida  2   Para  2   Term  2   Preterm      AB      Living  2      SAB      IAB      Ectopic      Multiple  0   Live Births  2            Home Medications    Prior to Admission medications   Medication Sig Start Date End Date Taking? Authorizing Provider  albuterol (VENTOLIN HFA) 108 (90 Base) MCG/ACT inhaler Inhale into the lungs every 6 (six) hours as needed for wheezing or shortness of breath.    [provider]  meloxicam  (MOBIC ) 15 MG tablet Take 1 tablet (15 mg total) by mouth daily. 04/21/23   Leonce Katz, DO  naproxen  (NAPROSYN ) 500 MG tablet Take 1 tablet (500 mg total) by  mouth 2 (two) times daily with a meal. As needed for neck or back pain. 04/16/23   Lucius Krabbe, NP  triamcinolone  ointment (KENALOG ) 0.5 % Apply 1 Application topically 2 (two) times daily. 04/16/23   Lucius Krabbe, NP    Family History Family History  Problem Relation Age of Onset   Hypertension Maternal Grandmother     Social History Social History   Tobacco Use   Smoking status: Never   Smokeless tobacco: Never  Vaping Use   Vaping status: Never Used  Substance Use Topics   Alcohol use: Yes    Comment: occ   Drug use: Yes    Types: Marijuana     Allergies   Patient has no known allergies.   Review of Systems Review of Systems  Gastrointestinal:  Positive for abdominal pain.  Genitourinary:  Positive for dysuria.     Physical Exam Triage Vital Signs ED Triage Vitals  Encounter Vitals Group     BP 05/04/24 1733 123/84     Girls Systolic BP Percentile --      Girls Diastolic  BP Percentile --      Boys Systolic BP Percentile --      Boys Diastolic BP Percentile --      Pulse Rate 05/04/24 1733 64     Resp 05/04/24 1733 16     Temp 05/04/24 1733 98.8 F (37.1 C)     Temp src --      SpO2 05/04/24 1733 97 %     Weight --      Height --      Head Circumference --      Peak Flow --      Pain Score 05/04/24 1730 6     Pain Loc --      Pain Education --      Exclude from Growth Chart --    No data found.  Updated Vital Signs BP 123/84   Pulse 64   Temp 98.8 F (37.1 C)   Resp 16   LMP 04/26/2024   SpO2 97%   Visual Acuity Right Eye Distance:   Left Eye Distance:   Bilateral Distance:    Right Eye Near:   Left Eye Near:    Bilateral Near:     Physical Exam Vitals and nursing note reviewed.  Constitutional:      Appearance: Normal appearance.  HENT:     Head: Normocephalic and atraumatic.  Eyes:     Pupils: Pupils are equal, round, and reactive to light.  Cardiovascular:     Rate and Rhythm: Normal rate.  Pulmonary:      Effort: Pulmonary effort is normal.  Abdominal:     General: Bowel sounds are normal. There is no distension.     Palpations: Abdomen is soft. There is no hepatomegaly or splenomegaly.     Tenderness: There is no right CVA tenderness, left CVA tenderness, guarding or rebound. Negative signs include Rovsing's sign and McBurney's sign.      Comments: There is mild tenderness with palpation just proximal to bellybutton.  There is no tenderness with palpation to lower right or left quadrants.  Skin:    General: Skin is warm and dry.  Neurological:     General: No focal deficit present.     Mental Status: She is alert and oriented to person, place, and time.  Psychiatric:        Mood and Affect: Mood normal.        Behavior: Behavior normal.      UC Treatments / Results  Labs (all labs ordered are listed, but only abnormal results are displayed) Labs Reviewed  POCT URINE DIPSTICK - Abnormal; Notable for the following components:      Result Value   Ketones, POC UA trace (5) (*)    All other components within normal limits  POCT URINE PREGNANCY    EKG   Radiology No results found.  Procedures Procedures (including critical care time)  Medications Ordered in UC Medications - No data to display  Initial Impression / Assessment and Plan / UC Course  I have reviewed the triage vital signs and the nursing notes.  Pertinent labs & imaging results that were available during my care of the patient were reviewed by me and considered in my medical decision making (see chart for details).     Reviewed exam and symptoms with patient.  UA is negative.  Negative urine hCG.  Abdominal x-ray wet read with some constipation otherwise no obvious abnormalities, will contact for any positive results based on radiology overread once available.  Patient reports improving abdominal pain.  Discussed ER evaluation but she prefers to monitor symptoms at home.  Her vital signs are stable and she is  well-appearing.  Advised her to continue to monitor symptoms and she can continue over-the-counter analgesics as needed.  She states she takes pear juice for her constipation and she will continue this.  Advised her to follow-up with her PCP in 2 days for recheck.  Strict ER precautions reviewed and patient verbalized understanding. Final diagnoses:  Dysuria  Abdominal pain, unspecified abdominal location   Discharge Instructions   None    ED Prescriptions   None    PDMP not reviewed this encounter.   Loreda Myla SAUNDERS, NP 05/04/24 [phone]

## 2024-05-04 NOTE — Discharge Instructions (Addendum)
 Continue to monitor your symptoms at home.  You may continue ibuprofen  or Tylenol  as needed.  Follow-up with your PCP in 2 days for recheck.  Please go to the ER if your symptoms do not improve and/or they worsen.  I hope you feel better soon!

## 2024-05-04 NOTE — ED Triage Notes (Addendum)
 Pt c/o bladder pain and lower back painx2d. Pt denies any other sx. Pt states took ibuprofen  800mg  at 1400 today and it helps the pain a little.

## 2024-05-14 ENCOUNTER — Encounter (HOSPITAL_COMMUNITY): Payer: Self-pay

## 2024-05-14 ENCOUNTER — Other Ambulatory Visit: Payer: Self-pay

## 2024-05-14 ENCOUNTER — Emergency Department (HOSPITAL_COMMUNITY)

## 2024-05-14 ENCOUNTER — Emergency Department (HOSPITAL_COMMUNITY)
Admission: EM | Admit: 2024-05-14 | Discharge: 2024-05-14 | Disposition: A | Discharge status: Home / Self Care | Attending: Emergency Medicine | Admitting: Emergency Medicine

## 2024-05-14 DIAGNOSIS — R1031 Right lower quadrant pain: Secondary | ICD-10-CM | POA: Insufficient documentation

## 2024-05-14 LAB — COMPREHENSIVE METABOLIC PANEL WITH GFR
ALT: 13 U/L (ref 0–44)
AST: 15 U/L (ref 15–41)
Albumin: 4.1 g/dL (ref 3.5–5.0)
Alkaline Phosphatase: 39 U/L (ref 38–126)
Anion gap: 6 (ref 5–15)
BUN: 12 mg/dL (ref 6–20)
CO2: 23 mmol/L (ref 22–32)
Calcium: 9.2 mg/dL (ref 8.9–10.3)
Chloride: 108 mmol/L (ref 98–111)
Creatinine, Ser: 0.71 mg/dL (ref 0.44–1.00)
GFR, Estimated: >60 mL/min (ref >60)
Glucose, Bld: 95 mg/dL (ref 70–99)
Potassium: 3.5 mmol/L (ref 3.5–5.1)
Sodium: 137 mmol/L (ref 135–145)
Total Bilirubin: 0.6 mg/dL (ref 0.0–1.2)
Total Protein: 7.8 g/dL (ref 6.5–8.1)

## 2024-05-14 LAB — CBC
HCT: 36.3 % (ref 36.0–46.0)
Hemoglobin: 11.8 g/dL — ABNORMAL LOW (ref 12.0–15.0)
MCH: 26.8 pg (ref 26.0–34.0)
MCHC: 32.5 g/dL (ref 30.0–36.0)
MCV: 82.3 fL (ref 80.0–100.0)
Platelets: 274 K/uL (ref 150–400)
RBC: 4.41 MIL/uL (ref 3.87–5.11)
RDW: 12.1 % (ref 11.5–15.5)
WBC: 6.3 K/uL (ref 4.0–10.5)
nRBC: 0 % (ref 0.0–0.2)

## 2024-05-14 LAB — HCG, SERUM, QUALITATIVE: Preg, Serum: NEGATIVE

## 2024-05-14 LAB — URINALYSIS, ROUTINE W REFLEX MICROSCOPIC
Bilirubin Urine: NEGATIVE
Glucose, UA: NEGATIVE mg/dL
Hgb urine dipstick: NEGATIVE
Ketones, ur: NEGATIVE mg/dL
Leukocytes,Ua: NEGATIVE
Nitrite: NEGATIVE
Protein, ur: NEGATIVE mg/dL
Specific Gravity, Urine: 1.016 (ref 1.005–1.030)
pH: 7 (ref 5.0–8.0)

## 2024-05-14 LAB — LIPASE, BLOOD: Lipase: 25 U/L (ref 11–51)

## 2024-05-14 MED ORDER — IOHEXOL 300 MG/ML  SOLN
100.0000 mL | Freq: Once | INTRAMUSCULAR | Status: AC | PRN
  Administered 2024-05-14: 100 mL via INTRAVENOUS

## 2024-05-14 NOTE — ED Provider Notes (Signed)
  EMERGENCY DEPARTMENT AT [facility] Provider Note   CSN: [id number] Arrival date & time: 05/14/24  1125     Patient presents with: Abdominal Pain   Cheryl Mcbride is a 33 y.o. female.   33 year old female with prior medical history as detailed below presents for evaluation.  Patient reports fairly constant abdominal pain for 1-1/2 to 2 weeks.  She reports that her pain is primarily in the right lower quadrant.  She reports intermittent constipation and then diarrhea.  She denies fever.  She denies vomiting.  She denies urinary symptoms.  She denies vaginal discharge or bleeding.  The history is provided by the patient and medical records.       Prior to Admission medications   Medication Sig Start Date End Date Taking? Authorizing Provider  albuterol (VENTOLIN HFA) 108 (90 Base) MCG/ACT inhaler Inhale into the lungs every 6 (six) hours as needed for wheezing or shortness of breath.    [provider]  meloxicam  (MOBIC ) 15 MG tablet Take 1 tablet (15 mg total) by mouth daily. 04/21/23   Leonce Katz, DO  naproxen  (NAPROSYN ) 500 MG tablet Take 1 tablet (500 mg total) by mouth 2 (two) times daily with a meal. As needed for neck or back pain. 04/16/23   Lucius Krabbe, NP  triamcinolone  ointment (KENALOG ) 0.5 % Apply 1 Application topically 2 (two) times daily. 04/16/23   Lucius Krabbe, NP    Allergies: Patient has no known allergies.    Review of Systems  All other systems reviewed and are negative.   Updated Vital Signs BP 134/85 (BP Location: Left Arm)   Pulse 86   Temp 98.6 F (37 C) (Oral)   Resp 18   Ht 5' 7 (1.702 m)   Wt 72.6 kg   LMP 04/26/2024 (Exact Date)   SpO2 100%   BMI 25.06 kg/m   Physical Exam Vitals and nursing note reviewed.  Constitutional:      General: She is not in acute distress.    Appearance: Normal appearance. She is well-developed.  HENT:     Head: Normocephalic and atraumatic.  Eyes:      Conjunctiva/sclera: Conjunctivae normal.     Pupils: Pupils are equal, round, and reactive to light.  Cardiovascular:     Rate and Rhythm: Normal rate and regular rhythm.     Heart sounds: Normal heart sounds. No murmur heard. Pulmonary:     Effort: Pulmonary effort is normal. No respiratory distress.     Breath sounds: Normal breath sounds.  Abdominal:     General: There is no distension.     Palpations: Abdomen is soft.     Tenderness: There is abdominal tenderness in the right lower quadrant.  Musculoskeletal:        General: No swelling or deformity. Normal range of motion.     Cervical back: Normal range of motion and neck supple.  Skin:    General: Skin is warm and dry.     Capillary Refill: Capillary refill takes less than 2 seconds.  Neurological:     General: No focal deficit present.     Mental Status: She is alert and oriented to person, place, and time.  Psychiatric:        Mood and Affect: Mood normal.     (all labs ordered are listed, but only abnormal results are displayed) Labs Reviewed  CBC - Abnormal; Notable for the following components:      Result Value   Hemoglobin 11.8 (*)  All other components within normal limits  LIPASE, BLOOD  COMPREHENSIVE METABOLIC PANEL WITH GFR  URINALYSIS, ROUTINE W REFLEX MICROSCOPIC  HCG, SERUM, QUALITATIVE    EKG: None  Radiology: No results found.   Procedures   Medications Ordered in the ED - No data to display                                  Medical Decision Making Patient is presenting for evaluation of acute on chronic abdominal discomfort.  Patient is nontoxic in appearance.  Screening labs obtained are without significant acute abnormality.  Additionally, patient CT imaging is also without acute abnormality.  Patient appears to be reassured by her workup findings today.  Patient reports that she has an already scheduled appointment with her PCP on Tuesday of next week.  Importance of close  follow-up is stressed.  Strict return precautions given and understood.  Amount and/or Complexity of Data Reviewed Labs: ordered. Radiology: ordered.  Risk Prescription drug management.        Final diagnoses:  Right lower quadrant abdominal pain    ED Discharge Orders     None          Laurice Maude BROCKS, MD 05/14/24 1431

## 2024-05-14 NOTE — Discharge Instructions (Signed)
 Return for any problem.  ?
